# Patient Record
Sex: Male | Born: 1975 | Race: White | Hispanic: No | Marital: Married | Smoking: Never smoker
Health system: Southern US, Community
[De-identification: ages and names within clinical notes are randomized; demographics above are authoritative.]

## PROBLEM LIST (undated history)

## (undated) HISTORY — PX: OTHER SURGICAL HISTORY: SHX169

---

## 2005-11-21 ENCOUNTER — Emergency Department: Payer: Self-pay | Admitting: Emergency Medicine

## 2007-09-20 ENCOUNTER — Emergency Department: Payer: Self-pay | Admitting: Emergency Medicine

## 2009-07-18 ENCOUNTER — Emergency Department: Payer: Self-pay | Admitting: Emergency Medicine

## 2010-05-03 ENCOUNTER — Inpatient Hospital Stay: Payer: Self-pay | Admitting: Internal Medicine

## 2012-11-15 ENCOUNTER — Emergency Department: Payer: Self-pay | Admitting: Emergency Medicine

## 2014-05-15 ENCOUNTER — Emergency Department: Payer: Self-pay | Admitting: Emergency Medicine

## 2015-01-30 ENCOUNTER — Emergency Department
Admission: EM | Admit: 2015-01-30 | Discharge: 2015-01-30 | Disposition: A | Discharge status: Home / Self Care | Payer: Self-pay | Attending: Emergency Medicine | Admitting: Emergency Medicine

## 2015-01-30 ENCOUNTER — Encounter: Payer: Self-pay | Admitting: Emergency Medicine

## 2015-01-30 DIAGNOSIS — R42 Dizziness and giddiness: Secondary | ICD-10-CM | POA: Insufficient documentation

## 2015-01-30 DIAGNOSIS — R110 Nausea: Secondary | ICD-10-CM | POA: Insufficient documentation

## 2015-01-30 LAB — URINALYSIS COMPLETE WITH MICROSCOPIC (ARMC ONLY)
BACTERIA UA: NONE SEEN
BILIRUBIN URINE: NEGATIVE
GLUCOSE, UA: NEGATIVE mg/dL
Hgb urine dipstick: NEGATIVE
Ketones, ur: NEGATIVE mg/dL
Leukocytes, UA: NEGATIVE
Nitrite: NEGATIVE
Protein, ur: NEGATIVE mg/dL
RBC / HPF: NONE SEEN RBC/hpf (ref 0–5)
Specific Gravity, Urine: 1.013 (ref 1.005–1.030)
Squamous Epithelial / LPF: NONE SEEN
WBC UA: NONE SEEN WBC/hpf (ref 0–5)
pH: 6 (ref 5.0–8.0)

## 2015-01-30 LAB — COMPREHENSIVE METABOLIC PANEL
ALBUMIN: 4.3 g/dL (ref 3.5–5.0)
ALK PHOS: 60 U/L (ref 38–126)
ALT: 21 U/L (ref 17–63)
AST: 20 U/L (ref 15–41)
Anion gap: 6 (ref 5–15)
BILIRUBIN TOTAL: 0.6 mg/dL (ref 0.3–1.2)
BUN: 16 mg/dL (ref 6–20)
CALCIUM: 9.2 mg/dL (ref 8.9–10.3)
CO2: 27 mmol/L (ref 22–32)
CREATININE: 0.87 mg/dL (ref 0.61–1.24)
Chloride: 104 mmol/L (ref 101–111)
GFR calc Af Amer: >60 mL/min (ref >60)
GFR calc non Af Amer: >60 mL/min (ref >60)
GLUCOSE: 97 mg/dL (ref 65–99)
Potassium: 4.1 mmol/L (ref 3.5–5.1)
SODIUM: 137 mmol/L (ref 135–145)
TOTAL PROTEIN: 7 g/dL (ref 6.5–8.1)

## 2015-01-30 LAB — LIPASE, BLOOD: LIPASE: 23 U/L (ref 22–51)

## 2015-01-30 LAB — CBC
HCT: 47.1 % (ref 40.0–52.0)
HEMOGLOBIN: 15.9 g/dL (ref 13.0–18.0)
MCH: 29.8 pg (ref 26.0–34.0)
MCHC: 33.8 g/dL (ref 32.0–36.0)
MCV: 88 fL (ref 80.0–100.0)
Platelets: 225 10*3/uL (ref 150–440)
RBC: 5.35 MIL/uL (ref 4.40–5.90)
RDW: 13 % (ref 11.5–14.5)
WBC: 6.3 10*3/uL (ref 3.8–10.6)

## 2015-01-30 MED ORDER — SODIUM CHLORIDE 0.9 % IV BOLUS (SEPSIS)
1000.0000 mL | Freq: Once | INTRAVENOUS | Status: AC
  Administered 2015-01-30: 1000 mL via INTRAVENOUS

## 2015-01-30 MED ORDER — ONDANSETRON 4 MG PO TBDP: 4.0000 mg | ORAL_TABLET | Freq: Three times a day (TID) | ORAL | Status: DC | PRN

## 2015-01-30 MED ORDER — ONDANSETRON HCL 4 MG/2ML IJ SOLN
4.0000 mg | Freq: Once | INTRAMUSCULAR | Status: AC
  Administered 2015-01-30: 4 mg via INTRAVENOUS
  Filled 2015-01-30: qty 2
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
## 2015-01-30 NOTE — ED Notes (Signed)
Pt states that he tolerated water
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
## 2015-01-30 NOTE — Discharge Instructions (Signed)
Please drink plenty of fluid to stay well hydrated. Please take a full liquid diet for the next 12-24 hours, then advance to a bland BRAT diet as tolerated.   Please make an appointment with your primary care physician or with [facility] for follow-up.  Please return to the emergency department if you develop fever, pain, inability to keep down fluids, or any other symptoms concerning to you.

## 2015-01-30 NOTE — ED Notes (Signed)
MD notified pt tolerated PO challeged well. No reports of vomitting. At time of assessment pt was resting in bed with eyes closed.

## 2015-06-03 ENCOUNTER — Encounter: Payer: Self-pay | Admitting: Emergency Medicine

## 2015-06-03 ENCOUNTER — Emergency Department
Admission: EM | Admit: 2015-06-03 | Discharge: 2015-06-03 | Disposition: A | Discharge status: Home / Self Care | Payer: BLUE CROSS/BLUE SHIELD | Attending: Emergency Medicine | Admitting: Emergency Medicine

## 2015-06-03 ENCOUNTER — Emergency Department: Payer: BLUE CROSS/BLUE SHIELD

## 2015-06-03 DIAGNOSIS — M25462 Effusion, left knee: Secondary | ICD-10-CM | POA: Insufficient documentation

## 2015-06-03 DIAGNOSIS — M25562 Pain in left knee: Secondary | ICD-10-CM

## 2015-06-03 MED ORDER — ALBUTEROL SULFATE (2.5 MG/3ML) 0.083% IN NEBU: 2.5000 mg | INHALATION_SOLUTION | Freq: Once | RESPIRATORY_TRACT | Status: DC

## 2015-06-03 MED ORDER — ETODOLAC 400 MG PO TABS: 400.0000 mg | ORAL_TABLET | Freq: Two times a day (BID) | ORAL | Status: DC

## 2015-06-03 MED ORDER — HYDROCODONE-ACETAMINOPHEN 5-325 MG PO TABS: 1.0000 | ORAL_TABLET | ORAL | Status: DC | PRN

## 2015-06-03 MED ORDER — HYDROCODONE-ACETAMINOPHEN 5-325 MG PO TABS
2.0000 | ORAL_TABLET | Freq: Once | ORAL | Status: AC
  Administered 2015-06-03: 2 via ORAL
  Filled 2015-06-03: qty 2

## 2015-06-03 NOTE — Discharge Instructions (Signed)
Take medication only as directed. Etodolac 400 mg twice a day with food. Norco as needed for severe pain. Do not drive while taking Norco as this may cause drowsiness. Begin wearing your knee immobilizer for support. Call and make an appointment with Dr. Hyacinth Meeker if no improvement in 3-4 days

## 2015-06-03 NOTE — ED Provider Notes (Signed)
[facility] Emergency Department Provider Note  ____________________________________________  Time seen: Approximately 9:53 PM  I have reviewed the triage vital signs and the nursing notes.   HISTORY  Chief Complaint Knee Pain   HPI Keith Fox is a 40 y.o. male is here complaining of left knee pain. Patient states that he injured his knee approximately one year ago and was seen in the emergency room. He states he was given a knee immobilizer but he is unsure if it was x-rayed. He states that yesterday he "reinjured it" and has had pain since that time. He is unaware of his particular injury that caused the pain to exacerbate. He has taken ibuprofen multiple times without any relief.Currently he rates his pain is 7 out of 10.   History reviewed. No pertinent past medical history.  There are no active problems to display for this patient.   History reviewed. No pertinent past surgical history.  Current Outpatient Rx  Name  Route  Sig  Dispense  Refill  . etodolac (LODINE) 400 MG tablet   Oral   Take 1 tablet (400 mg total) by mouth 2 (two) times daily.   20 tablet   0   . HYDROcodone-acetaminophen (NORCO/VICODIN) 5-325 MG tablet   Oral   Take 1 tablet by mouth every 4 (four) hours as needed for moderate pain.   20 tablet   0   . ondansetron (ZOFRAN ODT) 4 MG disintegrating tablet   Oral   Take 1 tablet (4 mg total) by mouth every 8 (eight) hours as needed for nausea or vomiting.   20 tablet   0     Allergies Review of patient's allergies indicates no known allergies.  No family history on file.  Social History Social History  Substance Use Topics  . Smoking status: Never Smoker   . Smokeless tobacco: None  . Alcohol Use: Yes    Review of Systems Constitutional: No fever/chills Cardiovascular: Denies chest pain. Respiratory: Denies shortness of breath. Gastrointestinal:   No nausea, no vomiting.  Musculoskeletal: Positive for  left knee pain. Skin: Negative for rash. Neurological: Negative for  focal weakness or numbness.  10-point ROS otherwise negative.  ____________________________________________   PHYSICAL EXAM:  VITAL SIGNS: ED Triage Vitals  Enc Vitals Group     BP 06/03/15 2125 152/77 mmHg     Pulse Rate 06/03/15 2125 73     Resp 06/03/15 2125 20     Temp 06/03/15 2125 98 F (36.7 C)     Temp Source 06/03/15 2125 Oral     SpO2 06/03/15 2125 95 %     Weight 06/03/15 2125 32 lb (14.515 kg)     Height 06/03/15 2125  (1.803 m)     Head Cir --      Peak Flow --      Pain Score 06/03/15 2125 7     Pain Loc --      Pain Edu? --      Excl. in GC? --     Constitutional: Alert and oriented. Well appearing and in no acute distress. Eyes: Conjunctivae are normal. PERRL. EOMI. Head: Atraumatic. Nose: No congestion/rhinnorhea. Neck: No stridor.   Cardiovascular: Normal rate, regular rhythm. Grossly normal heart sounds.  Good peripheral circulation. Respiratory: Normal respiratory effort.  No retractions. Lungs CTAB. Gastrointestinal: Soft and nontender. No distention.  Musculoskeletal: There is no gross deformity on examination of left knee. Range of motion is restricted secondary to patient's pain. Ligaments are stable  bilaterally. There is no effusion present. There is moderate tenderness on palpation of the medial aspect of the tibia. Neurologic:  Normal speech and language. No gross focal neurologic deficits are appreciated. No gait instability. Skin:  Skin is warm, dry and intact. No rash noted. No erythema, ecchymosis or abrasions were noted. Psychiatric: Mood and affect are normal. Speech and behavior are normal.  ____________________________________________   LABS (all labs ordered are listed, but only abnormal results are displayed)  Labs Reviewed - No data to display RADIOLOGY  Left knee per radiologist shows joint effusion without acute fracture or  dislocation. ____________________________________________   PROCEDURES  Procedure(s) performed: None  Critical Care performed: No  ____________________________________________   INITIAL IMPRESSION / ASSESSMENT AND PLAN / ED COURSE  Pertinent labs & imaging results that were available during my care of the patient were reviewed by me and considered in my medical decision making (see chart for details).  Patient has knee immobilizer at home. He is given a prescription for Norco as needed for severe pain and etodolac as needed for inflammation and pain. He is to follow-up with Dr. Hyacinth Meeker if any continued problems. He is also given a note stating that he needs to wear his knee immobilizer while at work. ____________________________________________   FINAL CLINICAL IMPRESSION(S) / ED DIAGNOSES  Final diagnoses:  Knee joint effusion, left  Knee pain, acute, left      Tommi Rumps, PA-C 06/03/15 2346  Sharman Cheek, MD 06/06/15 1220

## 2015-06-03 NOTE — ED Notes (Addendum)
Patient ambulatory to triage with steady gait, without difficulty or distress noted; pt reports left knee pain from injury year ago; st has been seen for such here before

## 2016-12-07 ENCOUNTER — Emergency Department
Admission: EM | Admit: 2016-12-07 | Discharge: 2016-12-07 | Disposition: A | Discharge status: Home / Self Care | Payer: Self-pay | Attending: Emergency Medicine | Admitting: Emergency Medicine

## 2016-12-07 ENCOUNTER — Emergency Department: Payer: Self-pay

## 2016-12-07 ENCOUNTER — Encounter: Payer: Self-pay | Admitting: Emergency Medicine

## 2016-12-07 DIAGNOSIS — X58XXXA Exposure to other specified factors, initial encounter: Secondary | ICD-10-CM | POA: Insufficient documentation

## 2016-12-07 DIAGNOSIS — Z79899 Other long term (current) drug therapy: Secondary | ICD-10-CM | POA: Insufficient documentation

## 2016-12-07 DIAGNOSIS — Y939 Activity, unspecified: Secondary | ICD-10-CM | POA: Insufficient documentation

## 2016-12-07 DIAGNOSIS — Y999 Unspecified external cause status: Secondary | ICD-10-CM | POA: Insufficient documentation

## 2016-12-07 DIAGNOSIS — Y929 Unspecified place or not applicable: Secondary | ICD-10-CM | POA: Insufficient documentation

## 2016-12-07 DIAGNOSIS — M545 Low back pain, unspecified: Secondary | ICD-10-CM

## 2016-12-07 DIAGNOSIS — S39012A Strain of muscle, fascia and tendon of lower back, initial encounter: Secondary | ICD-10-CM | POA: Insufficient documentation

## 2016-12-07 LAB — URINALYSIS, COMPLETE (UACMP) WITH MICROSCOPIC
Bacteria, UA: NONE SEEN
Bilirubin Urine: NEGATIVE
GLUCOSE, UA: NEGATIVE mg/dL
Hgb urine dipstick: NEGATIVE
Ketones, ur: NEGATIVE mg/dL
Leukocytes, UA: NEGATIVE
NITRITE: NEGATIVE
PROTEIN: 30 mg/dL — AB
Specific Gravity, Urine: 1.027 (ref 1.005–1.030)
pH: 5 (ref 5.0–8.0)

## 2016-12-07 MED ORDER — NABUMETONE 750 MG PO TABS: 750.0000 mg | ORAL_TABLET | Freq: Two times a day (BID) | ORAL | 0 refills | Status: DC

## 2016-12-07 MED ORDER — CYCLOBENZAPRINE HCL 5 MG PO TABS: 5.0000 mg | ORAL_TABLET | Freq: Three times a day (TID) | ORAL | 0 refills | Status: DC | PRN

## 2016-12-07 MED ORDER — ORPHENADRINE CITRATE 30 MG/ML IJ SOLN
60.0000 mg | INTRAMUSCULAR | Status: AC
  Administered 2016-12-07: 60 mg via INTRAMUSCULAR
  Filled 2016-12-07: qty 2

## 2016-12-07 MED ORDER — KETOROLAC TROMETHAMINE 60 MG/2ML IM SOLN
60.0000 mg | Freq: Once | INTRAMUSCULAR | Status: AC
  Administered 2016-12-07: 60 mg via INTRAMUSCULAR
  Filled 2016-12-07: qty 2

## 2016-12-07 NOTE — ED Triage Notes (Signed)
Patient arrives to [facility] ED via POV with C/O bilateral low back pain for 3 weeks, worsening over time. Unknown mechanism. States that he  a previous back injury 2 years ago

## 2016-12-07 NOTE — Discharge Instructions (Signed)
Your exam, x-ray, and urine tests were essentially normal today. You have some very mild degenerative changes (arthritis) in the lower back. Your symptoms are consistent with a low back muscle strain. Take the prescription meds as directed. Consider applying ice packs and/or moist heat to the low back for comfort. Follow-up with local primary care or community clinics for ongoing symptom management. Return to the ED for worsening symptoms.

## 2016-12-07 NOTE — ED Notes (Signed)

## 2016-12-07 NOTE — ED Notes (Addendum)
See triage note  States he developed pain to lower back about 3 weeks ago  Denies any specific injury  states pain is non radiating  Ambulates slowly but w/o limp  Denies any urinary sx's or bowel issues  States he has had min relief with OTC meds

## 2016-12-07 NOTE — ED Provider Notes (Signed)
[facility] Emergency Department Provider Note ____________________________________________  Time seen: 1112  I have reviewed the triage vital signs and the nursing notes.  HISTORY  Chief Complaint  Back Pain  HPI Keith Fox is a 41 y.o. male visits to the ED for evaluation of a 3 week complaint of intermittent back pain.patient denies any specific injury, accident, trauma, or mechanism preceding his pain. He reports a previous back injury some 8 years prior, but denies any chronic low back pain or interim treatment or management. He has been taking ibuprofen daily for pain relief. He denies any distal paresthesias, foot drop, bladder or bowel incontinence.  History reviewed. No pertinent past medical history.  There are no active problems to display for this patient.  History reviewed. No pertinent surgical history.  Prior to Admission medications   Medication Sig Start Date End Date Taking? Authorizing Provider  cyclobenzaprine (FLEXERIL) 5 MG tablet Take 1 tablet (5 mg total) by mouth 3 (three) times daily as needed for muscle spasms. 12/07/16   Eula Mazzola, Charlesetta Ivory, PA-C  etodolac (LODINE) 400 MG tablet Take 1 tablet (400 mg total) by mouth 2 (two) times daily. 06/03/15   Tommi Rumps, PA-C  HYDROcodone-acetaminophen (NORCO/VICODIN) 5-325 MG tablet Take 1 tablet by mouth every 4 (four) hours as needed for moderate pain. 06/03/15   Tommi Rumps, PA-C  nabumetone (RELAFEN) 750 MG tablet Take 1 tablet (750 mg total) by mouth 2 (two) times daily. 12/07/16   Syrus Nakama, Charlesetta Ivory, PA-C  ondansetron (ZOFRAN ODT) 4 MG disintegrating tablet Take 1 tablet (4 mg total) by mouth every 8 (eight) hours as needed for nausea or vomiting. 01/30/15   Rockne Menghini, MD   Allergies Patient has no known allergies.  History reviewed. No pertinent family history.  Social History Social History  Substance Use Topics  . Smoking status: Never Smoker  .  Smokeless tobacco: Not on file  . Alcohol use Yes    Review of Systems  Constitutional: Negative for fever. Cardiovascular: Negative for chest pain. Respiratory: Negative for shortness of breath. Gastrointestinal: Negative for abdominal pain, vomiting and diarrhea. Genitourinary: Negative for dysuria. Musculoskeletal: positivefor back pain. Skin: Negative for rash. Neurological: Negative for headaches, focal weakness or numbness. ____________________________________________  PHYSICAL EXAM:  VITAL SIGNS: ED Triage Vitals  Enc Vitals Group     BP 12/07/16 1054 (!) 145/81     Pulse Rate 12/07/16 1054 67     Resp 12/07/16 1054 18     Temp 12/07/16 1054 97.6 F (36.4 C)     Temp Source 12/07/16 1054 Oral     SpO2 12/07/16 1054 96 %     Weight 12/07/16 1054 (!) 350 lb (158.8 kg)     Height 12/07/16 1054 5\' 11"  (1.803 m)     Head Circumference --      Peak Flow --      Pain Score 12/07/16 1049 9     Pain Loc --      Pain Edu? --      Excl. in GC? --     Constitutional: Alert and oriented. Well appearing and in no distress. Pleasant patient with a large body habitus. Head: Normocephalic and atraumatic. Cardiovascular: Normal rate, regular rhythm. Normal distal pulses. Respiratory: Normal respiratory effort. No wheezes/rales/rhonchi. Gastrointestinal: Obese, soft and nontender. No distention, rebound, guarding, rigidity, or organomegaly. No CVA tenderness Musculoskeletal: Normal spinal alignment without midline tenderness, spasm, deformity, or step-off. Tenderness to palp over the lumbosacral paraspinal musculature.  Normal supine to sit transition. Normal lumbar flexion with decreased, self-limited extension ROM. Normal toe/heel raise. Normal hip flexion/extension. Nontender with normal range of motion in all extremities.  Neurologic:  CN II-XII grossly intact. Normal LE DTRs bilaterally. Normal gait without ataxia. Normal speech and language. No gross focal neurologic deficits are  appreciated. ____________________________________________   LABORATORY   Labs Reviewed  URINALYSIS, COMPLETE (UACMP) WITH MICROSCOPIC - Abnormal; Notable for the following:       Result Value   Color, Urine YELLOW (*)    APPearance CLOUDY (*)    Protein, ur 30 (*)    Squamous Epithelial / LPF 0-5 (*)    All other components within normal limits  _______________________________________________  RADIOLOGY  Lumbar Spine IMPRESSION: No acute abnormality in lumbar spine. Minimal degenerative disease. ____________________________________________  PROCEDURES  Toradol 60 mg IM Norflex 60 mg IM ____________________________________________  INITIAL IMPRESSION / ASSESSMENT AND PLAN / ED COURSE  Patient was ED evaluation of low back pain for the last 2 weeks. He denies any known injury, accident, or trauma. He presents now with x-rays negative for any acute fracture dislocation. Urinalysis negative. Kidney stone or bladder infection. He reports improvement after IM medication administration. He'll be discharged with transient for Relafen and cyclobenzaprine. He should follow-up with one of local community clinics for ongoing symptom management. Return precautions are reviewed. ____________________________________________  FINAL CLINICAL IMPRESSION(S) / ED DIAGNOSES  Final diagnoses:  Acute bilateral low back pain without sciatica  Strain of lumbar region, initial encounter      Lissa Hoard, PA-C 12/07/16 1242    Arnaldo Natal, MD 12/07/16 [id number]

## 2017-03-25 ENCOUNTER — Emergency Department
Admission: EM | Admit: 2017-03-25 | Discharge: 2017-03-26 | Disposition: A | Discharge status: Home / Self Care | Payer: Self-pay | Attending: Emergency Medicine | Admitting: Emergency Medicine

## 2017-03-25 DIAGNOSIS — X58XXXA Exposure to other specified factors, initial encounter: Secondary | ICD-10-CM | POA: Insufficient documentation

## 2017-03-25 DIAGNOSIS — S5002XA Contusion of left elbow, initial encounter: Secondary | ICD-10-CM | POA: Insufficient documentation

## 2017-03-25 DIAGNOSIS — Z79899 Other long term (current) drug therapy: Secondary | ICD-10-CM | POA: Insufficient documentation

## 2017-03-25 DIAGNOSIS — Y999 Unspecified external cause status: Secondary | ICD-10-CM | POA: Insufficient documentation

## 2017-03-25 DIAGNOSIS — Y939 Activity, unspecified: Secondary | ICD-10-CM | POA: Insufficient documentation

## 2017-03-25 DIAGNOSIS — Y9259 Other trade areas as the place of occurrence of the external cause: Secondary | ICD-10-CM | POA: Insufficient documentation

## 2017-03-25 LAB — URINE DRUG SCREEN, QUALITATIVE (ARMC ONLY)
Amphetamines, Ur Screen: NOT DETECTED
BARBITURATES, UR SCREEN: NOT DETECTED
BENZODIAZEPINE, UR SCRN: NOT DETECTED
CANNABINOID 50 NG, UR ~~LOC~~: NOT DETECTED
Cocaine Metabolite,Ur ~~LOC~~: NOT DETECTED
MDMA (Ecstasy)Ur Screen: NOT DETECTED
METHADONE SCREEN, URINE: NOT DETECTED
OPIATE, UR SCREEN: NOT DETECTED
Phencyclidine (PCP) Ur S: NOT DETECTED
Tricyclic, Ur Screen: NOT DETECTED

## 2017-03-25 LAB — BASIC METABOLIC PANEL
Anion gap: 13 (ref 5–15)
BUN: 22 mg/dL — ABNORMAL HIGH (ref 6–20)
CALCIUM: 9.6 mg/dL (ref 8.9–10.3)
CO2: 26 mmol/L (ref 22–32)
CREATININE: 0.94 mg/dL (ref 0.61–1.24)
Chloride: 97 mmol/L — ABNORMAL LOW (ref 101–111)
GFR calc non Af Amer: >60 mL/min (ref >60)
Glucose, Bld: 127 mg/dL — ABNORMAL HIGH (ref 65–99)
Potassium: 3.4 mmol/L — ABNORMAL LOW (ref 3.5–5.1)
SODIUM: 136 mmol/L (ref 135–145)

## 2017-03-25 LAB — CBC
HEMATOCRIT: 47.8 % (ref 40.0–52.0)
Hemoglobin: 16.5 g/dL (ref 13.0–18.0)
MCH: 30.2 pg (ref 26.0–34.0)
MCHC: 34.5 g/dL (ref 32.0–36.0)
MCV: 87.3 fL (ref 80.0–100.0)
Platelets: 278 10*3/uL (ref 150–440)
RBC: 5.47 MIL/uL (ref 4.40–5.90)
RDW: 13 % (ref 11.5–14.5)
WBC: 10.5 10*3/uL (ref 3.8–10.6)
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
## 2017-03-26 ENCOUNTER — Emergency Department: Payer: Self-pay

## 2017-03-26 MED ORDER — HYDROCODONE-ACETAMINOPHEN 5-325 MG PO TABS
1.0000 | ORAL_TABLET | Freq: Once | ORAL | Status: AC
  Administered 2017-03-26: 1 via ORAL
  Filled 2017-03-26: qty 1

## 2017-03-26 MED ORDER — IBUPROFEN 600 MG PO TABS: 600.0000 mg | ORAL_TABLET | Freq: Three times a day (TID) | ORAL | 0 refills | Status: DC | PRN

## 2017-03-26 MED ORDER — IBUPROFEN 600 MG PO TABS
600.0000 mg | ORAL_TABLET | Freq: Once | ORAL | Status: AC
  Administered 2017-03-26: 600 mg via ORAL
  Filled 2017-03-26: qty 1
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
## 2017-03-26 NOTE — Discharge Instructions (Signed)
Fortunately today your x-rays and your blood work were reassuring.  Please take ibuprofen as needed for pain and swelling and establish care with primary care this coming Monday for recheck.  Return to the emergency department sooner for any concerns whatsoever.  It was a pleasure to take care of you today, and thank you for coming to our emergency department.  If you have any questions or concerns before leaving please ask the nurse to grab me and I'm more than happy to go through your aftercare instructions again.  If you were prescribed any opioid pain medication today such as Norco, Vicodin, Percocet, morphine, hydrocodone, or oxycodone please make sure you do not drive when you are taking this medication as it can alter your ability to drive safely.  If you have any concerns once you are home that you are not improving or are in fact getting worse before you can make it to your follow-up appointment, please do not hesitate to call 911 and come back for further evaluation.  Merrily BrittleNeil Kaeden Mester, MD  Results for orders placed or performed during the hospital encounter of 03/25/17  CBC  Result Value Ref Range   WBC 10.5 3.8 - 10.6 K/uL   RBC 5.47 4.40 - 5.90 MIL/uL   Hemoglobin 16.5 13.0 - 18.0 g/dL   HCT 21.347.8 08.640.0 - 57.852.0 %   MCV 87.3 80.0 - 100.0 fL   MCH 30.2 26.0 - 34.0 pg   MCHC 34.5 32.0 - 36.0 g/dL   RDW 46.913.0 62.911.5 - 52.814.5 %   Platelets 278 150 - 440 K/uL  Basic metabolic panel  Result Value Ref Range   Sodium 136 135 - 145 mmol/L   Potassium 3.4 (L) 3.5 - 5.1 mmol/L   Chloride 97 (L) 101 - 111 mmol/L   CO2 26 22 - 32 mmol/L   Glucose, Bld 127 (H) 65 - 99 mg/dL   BUN 22 (H) 6 - 20 mg/dL   Creatinine, Ser 4.130.94 0.61 - 1.24 mg/dL   Calcium 9.6 8.9 - 24.410.3 mg/dL   GFR calc non Af Amer >60 >60 mL/min   GFR calc Af Amer >60 >60 mL/min   Anion gap 13 5 - 15  Urine Drug Screen, Qualitative ([facility] only)  Result Value Ref Range   Tricyclic, Ur Screen NONE DETECTED NONE DETECTED   Amphetamines, Ur Screen NONE DETECTED NONE DETECTED   MDMA (Ecstasy)Ur Screen NONE DETECTED NONE DETECTED   Cocaine Metabolite,Ur [place] NONE DETECTED NONE DETECTED   Opiate, Ur Screen NONE DETECTED NONE DETECTED   Phencyclidine (PCP) Ur S NONE DETECTED NONE DETECTED   Cannabinoid 50 Ng, Ur [place] NONE DETECTED NONE DETECTED   Barbiturates, Ur Screen NONE DETECTED NONE DETECTED   Benzodiazepine, Ur Scrn NONE DETECTED NONE DETECTED   Methadone Scn, Ur NONE DETECTED NONE DETECTED   Dg Elbow Complete Left  Result Date: 03/26/2017 CLINICAL DATA:  Initial evaluation for acute elbow pain. EXAM: LEFT ELBOW - COMPLETE 3+ VIEW COMPARISON:  None. FINDINGS: No acute fracture dislocation. No joint effusion. Radial head intact. Mild degenerative spurring present the radial head and olecranon. No soft tissue abnormality. IMPRESSION: No acute osseous abnormality about the left elbow. Electronically Signed   By: Rise MuBenjamin  McClintock M.D.   On: 03/26/2017 01:19   Dg Wrist Complete Left  Result Date: 03/26/2017 CLINICAL DATA:  Initial evaluation for acute wrist pain. EXAM: LEFT WRIST - COMPLETE 3+ VIEW COMPARISON:  None. FINDINGS: No acute fracture dislocation. Normal radiocarpal and distal radioulnar articulations intact. No  acute soft tissue abnormality. Osseous mineralization normal. IMPRESSION: No acute osseous abnormality about the left wrist. Electronically Signed   By: Rise MuBenjamin  McClintock M.D.   On: 03/26/2017 01:21   Dg Shoulder Left  Result Date: 03/26/2017 CLINICAL DATA:  Initial evaluation for acute upper extremity pain. EXAM: LEFT SHOULDER - 2+ VIEW COMPARISON:  None. FINDINGS: No acute fracture or dislocation. Humeral head in normal alignment with the glenoid. AC joint approximated. 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 No periarticular calcification. Visualized left hemithorax unremarkable. IMPRESSION: No acute osseous abnormality about the left shoulder.  Electronically Signed   By: Rise MuBenjamin  McClintock M.D.   On: 03/26/2017 01:16

## 2017-06-12 ENCOUNTER — Other Ambulatory Visit: Payer: Self-pay

## 2017-06-12 ENCOUNTER — Encounter: Payer: Self-pay | Admitting: Emergency Medicine

## 2017-06-12 ENCOUNTER — Emergency Department: Payer: No Typology Code available for payment source

## 2017-06-12 ENCOUNTER — Emergency Department
Admission: EM | Admit: 2017-06-12 | Discharge: 2017-06-12 | Disposition: A | Discharge status: Home / Self Care | Payer: No Typology Code available for payment source | Attending: Emergency Medicine | Admitting: Emergency Medicine

## 2017-06-12 DIAGNOSIS — J209 Acute bronchitis, unspecified: Secondary | ICD-10-CM | POA: Diagnosis not present

## 2017-06-12 DIAGNOSIS — Z79899 Other long term (current) drug therapy: Secondary | ICD-10-CM | POA: Diagnosis not present

## 2017-06-12 DIAGNOSIS — R05 Cough: Secondary | ICD-10-CM | POA: Diagnosis present

## 2017-06-12 MED ORDER — AZITHROMYCIN 250 MG PO TABS: ORAL_TABLET | ORAL | 0 refills | Status: DC

## 2017-06-12 MED ORDER — PREDNISONE 10 MG PO TABS: 50.0000 mg | ORAL_TABLET | Freq: Every day | ORAL | 0 refills | Status: DC

## 2017-06-12 MED ORDER — GUAIFENESIN-CODEINE 100-10 MG/5ML PO SOLN: 10.0000 mL | Freq: Three times a day (TID) | ORAL | 0 refills | Status: DC | PRN

## 2017-06-12 NOTE — ED Notes (Signed)
C/o body aches, chills/sweats, and cough. Pt states "everyone at my work has been sick with same"

## 2017-06-12 NOTE — ED Provider Notes (Signed)
[facility] Emergency Department Provider Note  ____________________________________________  Time seen: Approximately 4:17 PM  I have reviewed the triage vital signs and the nursing notes.   HISTORY  Chief Complaint Cough   HPI Keith Fox is a 42 y.o. male who presents to the emergency department for treatment and evaluation of cough, fatigue, sore throat, chills, and body aches that have been present for the past 2-3 weeks.  Patient has had multiple exposures to illnesses.  He has taken some over-the-counter cold medication with only temporary relief.  Patient does not smoke cigarettes.  He does not think that he has had a fever, but states that he has had cold chills.  History reviewed. No pertinent past medical history.  There are no active problems to display for this patient.   History reviewed. No pertinent surgical history.  Prior to Admission medications   Medication Sig Start Date End Date Taking? Authorizing Provider  azithromycin (ZITHROMAX) 250 MG tablet 2 tablets today, then 1 tablet for the next 4 days. 06/12/17   Yolander Goodie, Rulon Eisenmengerari B, FNP  cyclobenzaprine (FLEXERIL) 5 MG tablet Take 1 tablet (5 mg total) by mouth 3 (three) times daily as needed for muscle spasms. 12/07/16   Menshew, Charlesetta IvoryJenise V Bacon, PA-C  etodolac (LODINE) 400 MG tablet Take 1 tablet (400 mg total) by mouth 2 (two) times daily. 06/03/15   Tommi RumpsSummers, Rhonda L, PA-C  guaiFENesin-codeine 100-10 MG/5ML syrup Take 10 mLs by mouth 3 (three) times daily as needed. 06/12/17   Glessie Eustice B, FNP  ibuprofen (ADVIL,MOTRIN) 600 MG tablet Take 1 tablet (600 mg total) by mouth every 8 (eight) hours as needed. 03/26/17   Merrily Brittleifenbark, Neil, MD  nabumetone (RELAFEN) 750 MG tablet Take 1 tablet (750 mg total) by mouth 2 (two) times daily. 12/07/16   Menshew, Charlesetta IvoryJenise V Bacon, PA-C  ondansetron (ZOFRAN ODT) 4 MG disintegrating tablet Take 1 tablet (4 mg total) by mouth every 8 (eight) hours as needed for  nausea or vomiting. 01/30/15   Rockne MenghiniNorman, Anne-Caroline, MD  predniSONE (DELTASONE) 10 MG tablet Take 5 tablets (50 mg total) by mouth daily. 06/12/17   Chinita Pesterriplett, Siddhi Dornbush B, FNP    Allergies Patient has no known allergies.  History reviewed. No pertinent family history.  Social History Social History   Tobacco Use  . Smoking status: Never Smoker  . Smokeless tobacco: Never Used  Substance Use Topics  . Alcohol use: Yes  . Drug use: No    Review of Systems Constitutional: Negative for fever/positive for chills ENT: Positive for sore throat. Cardiovascular: Denies chest pain. Respiratory: Negative for shortness of breath.  Positive for cough. Gastrointestinal: Negative nausea, negative for vomiting.  No diarrhea.  Musculoskeletal: Positive for body aches Skin: Negative for rash. Neurological: Positive for headaches ____________________________________________   PHYSICAL EXAM:  VITAL SIGNS: ED Triage Vitals  Enc Vitals Group     BP 06/12/17 0837 (!) 147/100     Pulse Rate 06/12/17 0837 73     Resp 06/12/17 0837 20     Temp 06/12/17 0836 (!) 97.5 F (36.4 C)     Temp Source 06/12/17 0836 Oral     SpO2 06/12/17 0837 98 %     Weight 06/12/17 0836 (!) 340 lb (154.2 kg)     Height 06/12/17 0836 5\' 11"  (1.803 m)     Head Circumference --      Peak Flow --      Pain Score 06/12/17 0836 6     Pain  Loc --      Pain Edu? --      Excl. in GC? --     Constitutional: Alert and oriented.  Acutely ill appearing and in no acute distress. Eyes: Conjunctivae are normal. EOMI. Ears: Tympanic membranes are injected without erythema Nose: Sinus congestion noted; no rhinnorhea. Mouth/Throat: Mucous membranes are moist.  Oropharynx mildly erythematous. Tonsils 1+ without exudate. Neck: No stridor.  Lymphatic: Bilateral anterior cervical lymphadenopathy. Cardiovascular: Normal rate, regular rhythm. Good peripheral circulation. Respiratory: Normal respiratory effort.  No retractions.   Breath sounds clear to auscultation. Gastrointestinal: Soft and nontender.  Musculoskeletal: FROM x 4 extremities.  Neurologic:  Normal speech and language.  Skin:  Skin is warm, dry and intact. No rash noted. Psychiatric: Mood and affect are normal. Speech and behavior are normal.  ____________________________________________   LABS (all labs ordered are listed, but only abnormal results are displayed)  Labs Reviewed - No data to display ____________________________________________  EKG  Not indicated ____________________________________________  RADIOLOGY  Chest x-ray is negative for acute cardiopulmonary abnormality per radiology ____________________________________________   PROCEDURES  Procedure(s) performed: None  Critical Care performed: No ____________________________________________   INITIAL IMPRESSION / ASSESSMENT AND PLAN / ED COURSE  42 year old male presenting to the emergency department for treatment and evaluation of symptoms and most consistent with a bronchitis.  Due to length of illness, he will be treated with azithromycin.  He will also be given a prescription for prednisone and guaifenesin with codeine the patient was encouraged to rest and increase the amount of fluids that he is drinking.[name] He was encouraged to follow-up with his primary care provider or the primary care provider for choice for symptoms that are not improving over the next 2-3 days.  He was instructed to return to the emergency department for symptoms of change or worsen if he is unable to schedule an appointment to  Medications - No data to display  ED Discharge Orders        Ordered    guaiFENesin-codeine 100-10 MG/5ML syrup  3 times daily PRN     06/12/17 1032    predniSONE (DELTASONE) 10 MG tablet  Daily     06/12/17 1032    azithromycin (ZITHROMAX) 250 MG tablet     06/12/17 1032      Pertinent labs & imaging results that were available during my care of the patient were  reviewed by me and considered in my medical decision making (see chart for details).    If controlled substance prescribed during this visit, 12 month history viewed on the [facility] prior to issuing an initial prescription for Schedule II or III opiod. ____________________________________________   FINAL CLINICAL IMPRESSION(S) / ED DIAGNOSES  Final diagnoses:  Acute bronchitis, unspecified organism    Note:  This document was prepared using Dragon voice recognition software and may include unintentional dictation errors.     Chinita Pester, FNP 06/12/17 1620    Minna Antis, MD 06/16/17 1513

## 2017-06-12 NOTE — ED Triage Notes (Addendum)
Cough X 2-3 weeks per pt. He thought was getting a little better but now back.  Has also had sore throat.  Unlabored, VSS. Ambulatory.

## 2017-07-22 ENCOUNTER — Ambulatory Visit (INDEPENDENT_AMBULATORY_CARE_PROVIDER_SITE_OTHER): Payer: No Typology Code available for payment source | Admitting: Physician Assistant

## 2017-07-22 ENCOUNTER — Encounter: Payer: Self-pay | Admitting: Physician Assistant

## 2017-07-22 VITALS — BP 132/84 | HR 66 | Temp 98.4°F | Resp 16 | Ht 71.0 in | Wt 369.0 lb

## 2017-07-22 DIAGNOSIS — Z136 Encounter for screening for cardiovascular disorders: Secondary | ICD-10-CM

## 2017-07-22 DIAGNOSIS — Z13220 Encounter for screening for lipoid disorders: Secondary | ICD-10-CM | POA: Diagnosis not present

## 2017-07-22 DIAGNOSIS — G5611 Other lesions of median nerve, right upper limb: Secondary | ICD-10-CM

## 2017-07-22 DIAGNOSIS — N6320 Unspecified lump in the left breast, unspecified quadrant: Secondary | ICD-10-CM | POA: Diagnosis not present

## 2017-07-22 DIAGNOSIS — Z6841 Body Mass Index (BMI) 40.0 and over, adult: Secondary | ICD-10-CM

## 2017-07-22 DIAGNOSIS — J309 Allergic rhinitis, unspecified: Secondary | ICD-10-CM

## 2017-07-22 DIAGNOSIS — Z0000 Encounter for general adult medical examination without abnormal findings: Secondary | ICD-10-CM

## 2017-07-22 DIAGNOSIS — Z131 Encounter for screening for diabetes mellitus: Secondary | ICD-10-CM

## 2017-07-22 MED ORDER — MELOXICAM 15 MG PO TABS: 15.0000 mg | ORAL_TABLET | Freq: Every day | ORAL | 0 refills | Status: DC

## 2017-07-22 NOTE — Progress Notes (Signed)
Patient: Keith AreaDaniel Wayne Fox Male    DOB: 07/03/1975   42 y.o.   MRN: [id number] Visit Date: 07/22/2017  Today's Provider: Margaretann LovelessJennifer M Hollee Fate, PA-C   Chief Complaint  Patient presents with  . New Patient (Initial Visit)  . Cyst   Subjective:    HPI Patient comes in today to establish care and annual physical.  He c/o a mass in his left breast that has been there for several (2-3) months. He reports that it varies in size. He reports that it gets big, then gets small. He denies any tenderness or swelling. He has not used anything OTC for his symptoms. Did previously have nipple piercings. The left nipple piercing was "pulled out" twice. He is unsure if it may be scar tissue. There is no known family history of breast cancer.   He also reports that he has pain on his right elbow that has been present for several weeks. He reports that it is mildly tender, and denies any swelling or redness. Has recently picked up playing softball again once or twice per week after having the winter off.       No Known Allergies   Current Outpatient Medications:  .  azithromycin (ZITHROMAX) 250 MG tablet, 2 tablets today, then 1 tablet for the next 4 days. (Patient not taking: Reported on 07/22/2017), Disp: 6 each, Rfl: 0 .  cyclobenzaprine (FLEXERIL) 5 MG tablet, Take 1 tablet (5 mg total) by mouth 3 (three) times daily as needed for muscle spasms. (Patient not taking: Reported on 07/22/2017), Disp: 30 tablet, Rfl: 0 .  etodolac (LODINE) 400 MG tablet, Take 1 tablet (400 mg total) by mouth 2 (two) times daily. (Patient not taking: Reported on 07/22/2017), Disp: 20 tablet, Rfl: 0 .  guaiFENesin-codeine 100-10 MG/5ML syrup, Take 10 mLs by mouth 3 (three) times daily as needed. (Patient not taking: Reported on 07/22/2017), Disp: 120 mL, Rfl: 0 .  ibuprofen (ADVIL,MOTRIN) 600 MG tablet, Take 1 tablet (600 mg total) by mouth every 8 (eight) hours as needed. (Patient not taking: Reported on 07/22/2017),  Disp: 30 tablet, Rfl: 0 .  nabumetone (RELAFEN) 750 MG tablet, Take 1 tablet (750 mg total) by mouth 2 (two) times daily. (Patient not taking: Reported on 07/22/2017), Disp: 30 tablet, Rfl: 0 .  ondansetron (ZOFRAN ODT) 4 MG disintegrating tablet, Take 1 tablet (4 mg total) by mouth every 8 (eight) hours as needed for nausea or vomiting. (Patient not taking: Reported on 07/22/2017), Disp: 20 tablet, Rfl: 0 .  predniSONE (DELTASONE) 10 MG tablet, Take 5 tablets (50 mg total) by mouth daily. (Patient not taking: Reported on 07/22/2017), Disp: 25 tablet, Rfl: 0  Review of Systems  Constitutional: Negative.   HENT: Negative.   Eyes: Positive for itching.  Respiratory: Negative.   Cardiovascular: Negative.   Gastrointestinal: Negative.   Endocrine: Negative.   Genitourinary: Negative.   Musculoskeletal: Positive for arthralgias and myalgias. Negative for joint swelling.  Skin: Negative for color change and rash.  Allergic/Immunologic: Positive for environmental allergies.  Neurological: Negative.   Hematological: Negative for adenopathy. Does not bruise/bleed easily.  Psychiatric/Behavioral: Negative.     Social History   Tobacco Use  . Smoking status: Never Smoker  . Smokeless tobacco: Current User    Types: Chew  Substance Use Topics  . Alcohol use: Yes    Alcohol/week: 1.8 oz    Types: 3 Cans of beer per week   Objective:   BP 132/84 (BP Location:  Right Arm, Patient Position: Sitting, Cuff Size: Large)   Pulse 66   Temp 98.4 F (36.9 C)   Resp 16   Ht 5\' 11"  (1.803 m)   Wt (!) 369 lb (167.4 kg)   SpO2 96%   BMI 51.47 kg/m  Vitals:   07/22/17 0810  BP: 132/84  Pulse: 66  Resp: 16  Temp: 98.4 F (36.9 C)  SpO2: 96%  Weight: (!) 369 lb (167.4 kg)  Height: 5\' 11"  (1.803 m)     Physical Exam  Constitutional: He is oriented to person, place, and time. He appears well-developed and well-nourished.  HENT:  Head: Normocephalic and atraumatic.  Right Ear: Hearing,  tympanic membrane, external ear and ear canal normal.  Left Ear: Hearing, tympanic membrane, external ear and ear canal normal.  Nose: Nose normal.  Mouth/Throat: Uvula is midline, oropharynx is clear and moist and mucous membranes are normal.  Eyes: Pupils are equal, round, and reactive to light. Conjunctivae and EOM are normal. Right eye exhibits no discharge.  Neck: Normal range of motion. Neck supple. No tracheal deviation present. No thyromegaly present.  Cardiovascular: Normal rate, regular rhythm, normal heart sounds and intact distal pulses.  No murmur heard. Pulmonary/Chest: Effort normal and breath sounds normal. No respiratory distress. He has no wheezes. He has no rales. He exhibits no tenderness. Right breast exhibits no inverted nipple, no mass, no nipple discharge, no skin change and no tenderness. Left breast exhibits mass. Left breast exhibits no inverted nipple, no nipple discharge, no skin change and no tenderness. Breasts are symmetrical.    Abdominal: Soft. He exhibits no distension and no mass. There is no tenderness. There is no rebound and no guarding.  Musculoskeletal: He exhibits no edema.       Right shoulder: Normal.       Right elbow: He exhibits decreased range of motion and swelling. Tenderness found. Medial epicondyle tenderness noted.       Left elbow: Normal.       Right wrist: Normal.       Left wrist: Normal.  Pain noted with elbow flexion, supination and pronation. Tenderness noted over medial epicondyle as well as over pronator teres muscle  Lymphadenopathy:    He has no cervical adenopathy.  Neurological: He is alert and oriented to person, place, and time. He has normal reflexes. He displays normal reflexes. No cranial nerve deficit. He exhibits normal muscle tone. Coordination normal.  Skin: Skin is warm and dry. No rash noted. No erythema.  Psychiatric: He has a normal mood and affect. His behavior is normal. Judgment and thought content normal.    Vitals reviewed.       Assessment & Plan:     1. Annual physical exam Normal physical exam today. Will check labs as below and f/u pending lab results. If labs are stable and WNL he will not need to have these rechecked for one year at his next annual physical exam. He is to call the office in the meantime if he has any acute issue, questions or concerns. - CBC w/Diff/Platelet - Comprehensive Metabolic Panel (CMET) - TSH - Lipid Profile - HgB A1c  2. Allergic rhinitis, unspecified seasonality, unspecified trigger Most problematic symptom is dry, itchy eyes. Discussed adding eye drops for allergies prn. Does use tylenol sinus prn when having other symptoms.   3. Left breast mass Noted at 9 oclock 2-3 mm from nipple line of left breast. Suspect cyst or scar tissue but will get [place] as  below for further delineation.  - US BREAST COMPLETE UNI LEFT INC AXILLA; Future  4. Encounter to establish care No previous PCP.   5. Encounter for lipid screening for cardiovascular disease Will check labs as below and f/u pending results. - Lipid Profile  6. Screening for diabetes mellitus Will check labs as below and f/u pending results. - HgB A1c  7. Class 3 severe obesity due to excess calories without serious comorbidity with body mass index (BMI) of 50.0 to 59.9 in adult [facility]) Counseled patient on healthy lifestyle modifications including dieting and exercise. Will check labs as below and f/u pending results. - Comprehensive Metabolic Panel (CMET) - Lipid Profile - HgB A1c  8. 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 Discussed ROM exercises for the forearm. Slowly work back into throwing and swinging a bat, but rest for the next week. Meloxicam for inflammation. Heat prn. Ice immediately after a game.  - meloxicam (MOBIC) 15 MG tablet; Take 1 tablet (15 mg total) by mouth daily.  Dispense: 30 tablet; Refill: 0       Margaretann Loveless, PA-C   [facility]

## 2017-07-22 NOTE — Patient Instructions (Signed)
Golfer's Elbow Golfer's elbow, also called medial epicondylitis, is a condition that results from inflammation of the strong bands of tissue (tendons) that attach your forearm muscles to the inside of your bone at the elbow. These tendons affect the muscles that bend the palm toward the wrist (flexion). This condition is called golfer's elbow because it is more common among people who constantly bend and twist their wrists, such as golfers. This injury usually results from overuse. Tendons also become less flexible with age. This condition causes elbow pain that may spread to your forearm and upper arm. The pain may get worse when you bend your wrist downward. What are the causes? This condition is an overuse injury that is caused by:  Repeatedly flexing, turning, or twisting your wrist.  Constantly gripping objects with your hands.  What increases the risk? This condition is more likely to develop in people who play golf or tennis or have jobs that require the constant use of their hands. This injury is more common among:  Carpenters.  Gardeners.  Musicians.  Bricklayers.  Typists.  What are the signs or symptoms? Symptoms of this condition include:  Pain near the inner elbow or forearm.  Reduced grip strength.  How is this diagnosed? This condition is diagnosed based on your symptoms, medical history, and physical exam. During the exam, your health care provider may test your grip strength and move your wrist to check for pain. You may also have an MRI to confirm the diagnosis, look for other issues, and check for tears in the ligaments, muscles, or tendons. How is this treated? Treatment for this condition includes:  Stopping all activities that make you bend or twist your wrist until your pain and other symptoms go away.  Icing your wrist to relieve pain.  Taking NSAIDs or getting corticosteroid injections to reduce pain and swelling.  Doing stretches, range-of-motion,  and strengthening exercises (physical therapy) as told by your health care provider.  In rare cases, surgery may be needed if your condition does not improve. Follow these instructions at home:  If directed, apply ice to the injured area. ? Put ice in a plastic bag. ? Place a towel between your skin and the bag. ? Leave the ice on for 20 minutes, 2-3 times a day.  Move your fingers often to avoid stiffness.  Raise (elevate) the injured area above the level of your heart while you are sitting or lying down.  Return to your normal activities as told by your health care provider. Ask your health care provider what activities are safe for you.  Do exercises as told by your health care provider.  Do not use tobacco products, including cigarettes, chewing tobacco, or e-cigarettes. If you need help quitting, ask your health care provider.  Take over-the-counter and prescription medicines only as told by your health care provider.  Keep all follow-up visits as told by your health care provider. This is important. How is this prevented?  Warm up and stretch before being active.  Cool down and stretch after being active.  Give your body time to rest between periods of activity.  Make sure to use equipment that fits you.  Be safe and responsible while being active to avoid falls.  Do at least 150 minutes of moderate-intensity exercise each week, such as brisk walking or water aerobics.  Maintain physical fitness, including: ? Strength. ? Flexibility. ? Cardiovascular fitness. ? Endurance.  Perform exercises to strengthen the forearm muscles.  Slow your golf   swing to reduce shock in the arm when making contact with the ball, if you play golf. Contact a health care provider if:  Your pain does not improve or it gets worse.  You notice numbness in your hand. Get help right away if:  Your pain is severe.  You cannot move your wrist. This information is not intended to replace  advice given to you by your health care provider. Make sure you discuss any questions you have with your health care provider. Document Released: 03/31/2005 Document Revised: 12/04/2015 Document Reviewed: 12/11/2014 Elsevier Interactive Patient Education  2018 [employer]vier [employer]  

## 2017-07-23 ENCOUNTER — Telehealth: Payer: Self-pay

## 2017-07-23 LAB — CBC WITH DIFFERENTIAL/PLATELET
BASOS ABS: 0 10*3/uL (ref 0.0–0.2)
Basos: 1 %
EOS (ABSOLUTE): 0.2 10*3/uL (ref 0.0–0.4)
Eos: 3 %
HEMOGLOBIN: 16.3 g/dL (ref 13.0–17.7)
Hematocrit: 47.5 % (ref 37.5–51.0)
IMMATURE GRANS (ABS): 0 10*3/uL (ref 0.0–0.1)
Immature Granulocytes: 1 %
LYMPHS: 34 %
Lymphocytes Absolute: 2.2 10*3/uL (ref 0.7–3.1)
MCH: 30.8 pg (ref 26.6–33.0)
MCHC: 34.3 g/dL (ref 31.5–35.7)
MCV: 90 fL (ref 79–97)
MONOCYTES: 10 %
Monocytes Absolute: 0.6 10*3/uL (ref 0.1–0.9)
Neutrophils Absolute: 3.4 10*3/uL (ref 1.4–7.0)
Neutrophils: 51 %
PLATELETS: 226 10*3/uL (ref 150–379)
RBC: 5.3 x10E6/uL (ref 4.14–5.80)
RDW: 13.2 % (ref 12.3–15.4)
WBC: 6.4 10*3/uL (ref 3.4–10.8)

## 2017-07-23 LAB — LIPID PANEL
CHOLESTEROL TOTAL: 171 mg/dL (ref 100–199)
Chol/HDL Ratio: 4.5 ratio (ref 0.0–5.0)
HDL: 38 mg/dL — ABNORMAL LOW (ref >39)
LDL Calculated: 95 mg/dL (ref 0–99)
Triglycerides: 188 mg/dL — ABNORMAL HIGH (ref 0–149)
VLDL CHOLESTEROL CAL: 38 mg/dL (ref 5–40)

## 2017-07-23 LAB — COMPREHENSIVE METABOLIC PANEL
ALBUMIN: 4.5 g/dL (ref 3.5–5.5)
ALK PHOS: 68 IU/L (ref 39–117)
ALT: 56 IU/L — ABNORMAL HIGH (ref 0–44)
AST: 39 IU/L (ref 0–40)
Albumin/Globulin Ratio: 1.7 (ref 1.2–2.2)
BUN / CREAT RATIO: 22 — AB (ref 9–20)
BUN: 18 mg/dL (ref 6–24)
Bilirubin Total: 0.4 mg/dL (ref 0.0–1.2)
CO2: 22 mmol/L (ref 20–29)
CREATININE: 0.83 mg/dL (ref 0.76–1.27)
Calcium: 9.6 mg/dL (ref 8.7–10.2)
Chloride: 102 mmol/L (ref 96–106)
GFR calc Af Amer: 126 mL/min/{1.73_m2} (ref >59)
GFR calc non Af Amer: 109 mL/min/{1.73_m2} (ref >59)
GLUCOSE: 109 mg/dL — AB (ref 65–99)
Globulin, Total: 2.7 g/dL (ref 1.5–4.5)
Potassium: 4.4 mmol/L (ref 3.5–5.2)
Sodium: 140 mmol/L (ref 134–144)
TOTAL PROTEIN: 7.2 g/dL (ref 6.0–8.5)

## 2017-07-23 LAB — HEMOGLOBIN A1C
Est. average glucose Bld gHb Est-mCnc: 140 mg/dL
HEMOGLOBIN A1C: 6.5 % — AB (ref 4.8–5.6)

## 2017-07-23 LAB — TSH: TSH: 2.68 u[IU]/mL (ref 0.450–4.500)

## 2017-07-23 NOTE — Telephone Encounter (Signed)
Patient viewed message and lab results in Viewed by Mauricia Areaaniel Wayne Laprise on 07/23/2017 12:58 PM   Thanks,  -Joseiline

## 2017-07-23 NOTE — Telephone Encounter (Signed)
-----   Message from Margaretann LovelessJennifer M Burnette, [place]-C sent at 07/23/2017 12:47 PM EDT ----- Recommend rechecking in 3-6 months.

## 2017-07-23 NOTE — Telephone Encounter (Signed)
-----   Message from Margaretann LovelessJennifer M Burnette, [place]-C sent at 07/23/2017 12:46 PM EDT ----- Cholesterol is normal but triglycerides are borderline high at 188 (would like at or below 150). This reading of cholesterol is most closely related to dietary habits. Also one portion of liver enzymes is slightly elevated as well. Again most often related to dietary habits. A1c is also at 6.5 which is indicative of diabetes. I would recommend really working on lifestyle modifications with dieting (limiting carbs, sugars, red meats and fatty foods) and increasing physical activity to try to get 150 minutes of moderate exercise per week. Also limit alcohol consumption and tylenol (acetaminophen) based products for the liver function. Thyroid is normal. Blood count is normal. Kidney function is normal.

## 2017-07-24 ENCOUNTER — Telehealth: Payer: Self-pay | Admitting: Physician Assistant

## 2017-07-24 DIAGNOSIS — N6320 Unspecified lump in the left breast, unspecified quadrant: Secondary | ICD-10-CM

## 2017-07-24 NOTE — Telephone Encounter (Signed)
error 

## 2017-07-24 NOTE — Telephone Encounter (Signed)
ordered

## 2017-07-24 NOTE — Addendum Note (Signed)
Addended by: Margaretann LovelessBURNETTE, Archibald Marchetta M on: 07/24/2017 01:32 PM   Modules accepted: Orders

## 2017-07-24 NOTE — Telephone Encounter (Signed)
Keith Fox is requesting an order for diagnostic bilateral mammogram TOMO [id number] and right breast ultrasound limited [id number]

## 2017-08-04 ENCOUNTER — Ambulatory Visit
Admission: RE | Admit: 2017-08-04 | Discharge: 2017-08-04 | Disposition: A | Discharge status: Home / Self Care | Payer: No Typology Code available for payment source | Source: Ambulatory Visit | Attending: Physician Assistant | Admitting: Physician Assistant

## 2017-08-04 DIAGNOSIS — N62 Hypertrophy of breast: Secondary | ICD-10-CM | POA: Insufficient documentation

## 2017-08-04 DIAGNOSIS — N6322 Unspecified lump in the left breast, upper inner quadrant: Secondary | ICD-10-CM | POA: Insufficient documentation

## 2017-08-04 DIAGNOSIS — N6320 Unspecified lump in the left breast, unspecified quadrant: Secondary | ICD-10-CM | POA: Diagnosis present

## 2017-08-04 DIAGNOSIS — N6324 Unspecified lump in the left breast, lower inner quadrant: Secondary | ICD-10-CM | POA: Insufficient documentation

## 2017-10-26 ENCOUNTER — Emergency Department
Admission: EM | Admit: 2017-10-26 | Discharge: 2017-10-26 | Disposition: A | Discharge status: Home / Self Care | Payer: No Typology Code available for payment source | Attending: Emergency Medicine | Admitting: Emergency Medicine

## 2017-10-26 ENCOUNTER — Emergency Department: Payer: No Typology Code available for payment source

## 2017-10-26 ENCOUNTER — Other Ambulatory Visit: Payer: Self-pay

## 2017-10-26 ENCOUNTER — Encounter: Payer: Self-pay | Admitting: Emergency Medicine

## 2017-10-26 DIAGNOSIS — M25562 Pain in left knee: Secondary | ICD-10-CM | POA: Diagnosis not present

## 2017-10-26 DIAGNOSIS — F17220 Nicotine dependence, chewing tobacco, uncomplicated: Secondary | ICD-10-CM | POA: Insufficient documentation

## 2017-10-26 DIAGNOSIS — N611 Abscess of the breast and nipple: Secondary | ICD-10-CM | POA: Insufficient documentation

## 2017-10-26 DIAGNOSIS — L0291 Cutaneous abscess, unspecified: Secondary | ICD-10-CM | POA: Diagnosis present

## 2017-10-26 LAB — BASIC METABOLIC PANEL
Anion gap: 9 (ref 5–15)
BUN: 20 mg/dL (ref 6–20)
CO2: 23 mmol/L (ref 22–32)
Calcium: 9.3 mg/dL (ref 8.9–10.3)
Chloride: 107 mmol/L (ref 98–111)
Creatinine, Ser: 0.91 mg/dL (ref 0.61–1.24)
GFR calc Af Amer: >60 mL/min (ref >60)
GFR calc non Af Amer: >60 mL/min (ref >60)
Glucose, Bld: 231 mg/dL — ABNORMAL HIGH (ref 70–99)
Potassium: 3.3 mmol/L — ABNORMAL LOW (ref 3.5–5.1)
Sodium: 139 mmol/L (ref 135–145)

## 2017-10-26 LAB — CBC WITH DIFFERENTIAL/PLATELET
Basophils Absolute: 0.1 10*3/uL (ref 0–0.1)
Basophils Relative: 1 %
Eosinophils Absolute: 0.4 10*3/uL (ref 0–0.7)
Eosinophils Relative: 4 %
HCT: 44.3 % (ref 40.0–52.0)
Hemoglobin: 15.7 g/dL (ref 13.0–18.0)
Lymphocytes Relative: 29 %
Lymphs Abs: 2.6 10*3/uL (ref 1.0–3.6)
MCH: 31.2 pg (ref 26.0–34.0)
MCHC: 35.5 g/dL (ref 32.0–36.0)
MCV: 87.7 fL (ref 80.0–100.0)
Monocytes Absolute: 1.1 10*3/uL — ABNORMAL HIGH (ref 0.2–1.0)
Monocytes Relative: 12 %
Neutro Abs: 4.8 10*3/uL (ref 1.4–6.5)
Neutrophils Relative %: 54 %
Platelets: 226 10*3/uL (ref 150–440)
RBC: 5.05 MIL/uL (ref 4.40–5.90)
RDW: 13.2 % (ref 11.5–14.5)
WBC: 8.9 10*3/uL (ref 3.8–10.6)

## 2017-10-26 MED ORDER — CLINDAMYCIN HCL 300 MG PO CAPS: 300.0000 mg | ORAL_CAPSULE | Freq: Three times a day (TID) | ORAL | 0 refills | Status: AC

## 2017-10-26 MED ORDER — CLINDAMYCIN HCL 150 MG PO CAPS
300.0000 mg | ORAL_CAPSULE | Freq: Once | ORAL | Status: AC
  Administered 2017-10-26: 300 mg via ORAL
  Filled 2017-10-26: qty 2

## 2017-10-26 MED ORDER — LIDOCAINE HCL (PF) 1 % IJ SOLN
INTRAMUSCULAR | Status: AC
Start: 2017-10-26 — End: 2017-10-26
  Administered 2017-10-26: 5 mL
  Filled 2017-10-26: qty 5

## 2017-10-26 MED ORDER — LIDOCAINE 5 % EX PTCH
MEDICATED_PATCH | CUTANEOUS | Status: AC
  Filled 2017-10-26: qty 1

## 2017-10-26 MED ORDER — LIDOCAINE HCL 1 % IJ SOLN
5.0000 mL | Freq: Once | INTRAMUSCULAR | Status: AC
  Administered 2017-10-26: 5 mL

## 2017-10-26 MED ORDER — MELOXICAM 15 MG PO TABS: 15.0000 mg | ORAL_TABLET | Freq: Every day | ORAL | 1 refills | Status: AC

## 2017-10-26 NOTE — ED Notes (Signed)
Patient transported to Ultrasound 

## 2017-10-26 NOTE — ED Notes (Signed)
Pt reports pain to left knee that began about 3 days ago, pt denies inj to knee states he has had swelling that comes and goes to right knee. Pt states pain when he bends it or put pressure on it for a while.  Pt also reports red hard bump to left pectoral area, no drainage noted. Pt states he has had this occur before. Pt states at this time it itches and is painful on palpation. Pt states he use to have his nipples pierced but took them out "a while ago." Redness and lump noted at site.

## 2017-10-26 NOTE — ED Triage Notes (Signed)
Pt reports pain to left knee for about 3 days,  Reports gradually getting worse, denies injury, pt also reports redness and swelling to left pectoral area near nipple area, redness noted area it is warm to touch, pt talks in complete sentences no distress noted

## 2017-10-26 NOTE — ED Provider Notes (Signed)
[facility] Emergency Department Provider Note  ____________________________________________  Time seen: Approximately 10:00 PM  I have reviewed the triage vital signs and the nursing notes.   HISTORY  Chief Complaint Knee Pain and Abscess (left pectoral area )    HPI Keith Fox is a 42 y.o. male presents to the emergency department with 2 complaints.  Patient reports for the past 2 days, he has had redness and hardness surrounding his left nipple.  Patient reports that if he "squeezes his nipple hard enough", he can express purulent exudate.  Patient reports that he has experienced similar symptoms in the past intermittently but seemed to resolve without any type of intervention.  Patient reports that he had a mammogram one year ago which was reassuring.  Patient reports that he had a nipple ring placed 6 years ago that he took out himself.  Patient never had breast issues prior to placement of nipple ring.  He denies fever and chills.  He secondarily reports 7 out of 10 aching medial left knee pain that is worsened with ambulation and relieved with rest.  He denies falls or mechanisms of trauma.  No prior left knee surgeries.  No alleviating measures of been attempted.   History reviewed. No pertinent past medical history.  Patient Active Problem List   Diagnosis Date Noted  . Allergic rhinitis 07/22/2017  . Class 3 severe obesity due to excess calories without serious comorbidity with body mass index (BMI) of 50.0 to 59.9 in adult [facility](HCC) 07/22/2017    Past Surgical History:  Procedure Laterality Date  . OTHER SURGICAL HISTORY     had ulcer removed from small intestines    Prior to Admission medications   Medication Sig Start Date End Date Taking? Authorizing Provider  meloxicam (MOBIC) 15 MG tablet Take 1 tablet (15 mg total) by mouth daily. 07/22/17   Margaretann LovelessBurnette, Jennifer M, PA-C    Allergies Patient has no known allergies.  Family History   Problem Relation Age of Onset  . Parkinson's disease Father   . Breast cancer Neg Hx     Social History Social History   Tobacco Use  . Smoking status: Never Smoker  . Smokeless tobacco: Current User    Types: Chew  Substance Use Topics  . Alcohol use: Yes    Alcohol/week: 1.8 oz    Types: 3 Cans of beer per week  . Drug use: No     Review of Systems  Constitutional: No fever/chills Eyes: No visual changes. No discharge ENT: No upper respiratory complaints. Cardiovascular: no chest pain. Respiratory: no cough. No SOB. Gastrointestinal: No abdominal pain.  No nausea, no vomiting.  No diarrhea.  No constipation. Musculoskeletal: Patient has left knee pain.  Skin: Patient has erythema and induration of left breast. Neurological: Negative for headaches, focal weakness or numbness.   ____________________________________________   PHYSICAL EXAM:  VITAL SIGNS: ED Triage Vitals  Enc Vitals Group     BP 10/26/17 2029 (!) 169/78     Pulse Rate 10/26/17 2029 90     Resp 10/26/17 2029 20     Temp 10/26/17 2029 98.6 F (37 C)     Temp Source 10/26/17 2029 Oral     SpO2 10/26/17 2029 94 %     Weight 10/26/17 2030 (!) 350 lb (158.8 kg)     Height 10/26/17 2030 5\' 11"  (1.803 m)     Head Circumference --      Peak Flow --  Pain Score 10/26/17 2029 6     Pain Loc --      Pain Edu? --      Excl. in GC? --      Constitutional: Alert and oriented. Well appearing and in no acute distress. Eyes: Conjunctivae are normal. PERRL. EOMI. Head: Atraumatic. ENT:      Ears: TMs are pearly.      Nose: No congestion/rhinnorhea.      Mouth/Throat: Mucous membranes are moist.  Neck: No stridor.  No cervical spine tenderness to palpation. Cardiovascular: Normal rate, regular rhythm. Normal S1 and S2.  Good peripheral circulation. Respiratory: Normal respiratory effort without tachypnea or retractions. Lungs CTAB. Good air entry to the bases with no decreased or absent breath  sounds. Gastrointestinal: Bowel sounds 4 quadrants. Soft and nontender to palpation. No guarding or rigidity. No palpable masses. No distention. No CVA tenderness. Musculoskeletal: Full range of motion to all extremities. No gross deformities appreciated.  Peripatellar dimpling visualized, left.  Negative ballottement, left No popliteal cyst palpated, left.  Negative anterior and posterior drawer test, left.  Positive McMurray, left.  Patient has tenderness elicited with palpation over the medial aspect of the knee, left.  Palpable dorsalis pedis pulse, left. Neurologic:  Normal speech and language. No gross focal neurologic deficits are appreciated.  Skin: Patient has erythema and induration surrounding the left areola.  Erythema and induration predominate on the medial side between the 7:00 and 11 o'clock position.    ____________________________________________   LABS (all labs ordered are listed, but only abnormal results are displayed)  Labs Reviewed  CBC WITH DIFFERENTIAL/PLATELET  BASIC METABOLIC PANEL   ____________________________________________  EKG   ____________________________________________  RADIOLOGY I personally viewed and evaluated these images as part of my medical decision making, as well as reviewing the written report by the radiologist.  Dg Knee Complete 4 Views Left  Result Date: 10/26/2017 CLINICAL DATA:  Left knee pain and swelling for the past 3 days. No known injury. EXAM: LEFT KNEE - COMPLETE 4+ VIEW COMPARISON:  06/03/2015 FINDINGS: No fracture or dislocation. 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 No evidence of chondrocalcinosis. No joint effusion. Regional soft tissues appear normal. IMPRESSION: 1. No acute findings. 2. 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 Electronically Signed   By: Simonne Come M.D.   On: 10/26/2017 20:56    ____________________________________________    PROCEDURES  Procedure(s) performed:    Procedures INCISION AND DRAINAGE Performed by: Orvil Feil Consent: Verbal consent obtained. Risks and benefits: risks, benefits and alternatives were discussed Type: abscess  Body area: Left breast  Anesthesia: local infiltration using 1% lidocaine  Incision was made with an 18-gauge needle  Local anesthetic: lidocaine 1% without epinephrine  Anesthetic total: 2 ml  Complexity: complex Blunt dissection to break up loculations  Drainage: purulent  Drainage amount: Small  Patient tolerance: Patient tolerated the procedure well with no immediate complications.      Medications - No data to display   ____________________________________________   INITIAL IMPRESSION / ASSESSMENT AND PLAN / ED COURSE  Pertinent labs & imaging results that were available during my care of the patient were reviewed by me and considered in my medical decision making (see chart for details).  Review of the [place] CSRS was performed in accordance of the [facility] prior to  dispensing any controlled drugs.      Assessment and plan Breast abscess Patient presents to the emergency department with erythema and induration surrounding left areola for the past two days.  An ultrasound was obtained which revealed a complex fluid collection consistent with an abscess.  Dr. Satira Mccallum (general surgery) was consulted regarding patient's case.  Dr. Earlene Plater recommended bedside needle aspiration using ultrasound by ED provider.  Using bedside ultrasound, needle aspiration occurred in the emergency department which yielded a small amount of purulent exudate.  I suspect that patient still has residual abscess that did not drain in the emergency department tonight.   Patient was started empirically on clindamycin and referred to Dr. Satira Mccallum for follow-up and further evaluation.  Vital signs were reassuring prior to discharge.  White blood cell count was within reference range in the emergency department.  All patient questions were answered prior to discharge.  For left knee pain, x-ray examination of the left knee occurred.  Tricompartmental arthritis was visualized.  Patient was discharged with meloxicam and advised to follow up with ortho.    ____________________________________________  FINAL CLINICAL IMPRESSION(S) / ED DIAGNOSES  Final diagnoses:  Breast abscess      NEW MEDICATIONS STARTED DURING THIS VISIT:  ED Discharge Orders    None          This chart was dictated using voice recognition software/Dragon. Despite best efforts to proofread, errors can occur which can change the meaning. Any change was purely unintentional.    Orvil Feil, PA-C 10/26/17 2349    Phineas Semen, MD 10/27/17 [phone]

## 2018-01-06 ENCOUNTER — Other Ambulatory Visit: Payer: Self-pay

## 2018-01-06 ENCOUNTER — Emergency Department (HOSPITAL_COMMUNITY)
Admission: EM | Admit: 2018-01-06 | Discharge: 2018-01-06 | Disposition: A | Discharge status: Home / Self Care | Payer: No Typology Code available for payment source | Attending: Emergency Medicine | Admitting: Emergency Medicine

## 2018-01-06 DIAGNOSIS — F17220 Nicotine dependence, chewing tobacco, uncomplicated: Secondary | ICD-10-CM | POA: Insufficient documentation

## 2018-01-06 DIAGNOSIS — N611 Abscess of the breast and nipple: Secondary | ICD-10-CM

## 2018-01-06 DIAGNOSIS — L02213 Cutaneous abscess of chest wall: Secondary | ICD-10-CM | POA: Diagnosis not present

## 2018-01-06 MED ORDER — SULFAMETHOXAZOLE-TRIMETHOPRIM 800-160 MG PO TABS: 1.0000 | ORAL_TABLET | Freq: Two times a day (BID) | ORAL | 0 refills | Status: AC

## 2018-01-06 MED ORDER — LIDOCAINE-EPINEPHRINE (PF) 2 %-1:200000 IJ SOLN
20.0000 mL | Freq: Once | INTRAMUSCULAR | Status: AC
  Administered 2018-01-06: 20 mL via INTRADERMAL
  Filled 2018-01-06: qty 20

## 2018-01-06 MED ORDER — OXYCODONE-ACETAMINOPHEN 5-325 MG PO TABS: 1.0000 | ORAL_TABLET | ORAL | 0 refills | Status: DC | PRN

## 2018-01-06 MED ORDER — SULFAMETHOXAZOLE-TRIMETHOPRIM 800-160 MG PO TABS
1.0000 | ORAL_TABLET | Freq: Once | ORAL | Status: AC
  Administered 2018-01-06: 1 via ORAL
  Filled 2018-01-06: qty 1

## 2018-01-06 MED ORDER — OXYCODONE-ACETAMINOPHEN 5-325 MG PO TABS
1.0000 | ORAL_TABLET | Freq: Once | ORAL | Status: AC
  Administered 2018-01-06: 1 via ORAL
  Filled 2018-01-06: qty 1

## 2018-01-06 NOTE — ED Provider Notes (Signed)
Medical screening examination/treatment/procedure(s) were conducted as a shared visit with non-physician practitioner(s) and myself.  I personally evaluated the patient during the encounter.  None Patient was redness and swelling adjacent to left nipple.  He has had prior cellulitis in the area.  Patient has been seen in the breast center and has not had any cancer diagnosis.  Patient has area of tender erythema surrounding the left nipple and extending onto the soft tissues of the breast.  Ultrasound shows large fluid collection.  I recommend proceeding with incision and drainage.  I agree with plan and management.  Patient will be rescheduling with breast center for reevaluation.   Arby Barrette, MD 01/06/18 [phone]

## 2018-01-06 NOTE — ED Notes (Signed)
ID at bed side.

## 2018-01-06 NOTE — ED Provider Notes (Signed)
[facility]-EMERGENCY DEPT Provider Note   CSN: [id number] Arrival date & time: 01/06/18  1741     History   Chief Complaint Chief Complaint  Patient presents with  . Abscess    HPI Keith Fox is a 42 y.o. male.  HPI   Keith Fox is a 42 year old male with a history of obesity and left breast cyst who presents to the emergency department for evaluation of erythema, pain and swelling over the left breast.  Patient reports that pain and swelling started a week ago and has gotten progressively worse.  He reports pain is 6/10 in severity and feels throbbing, worsened with palpation.  No drainage from the site.  He has a history of similar symptoms a few months ago and was referred to the breast center.  He was told that he has cysts, no cancer.  Has never required I&D.  He denies fevers, chills, abdominal pain, vomiting.  No past medical history on file.  Patient Active Problem List   Diagnosis Date Noted  . Allergic rhinitis 07/22/2017  . Class 3 severe obesity due to excess calories without serious comorbidity with body mass index (BMI) of 50.0 to 59.9 in adult [facility]) 07/22/2017    Past Surgical History:  Procedure Laterality Date  . OTHER SURGICAL HISTORY     had ulcer removed from small intestines        Home Medications    Prior to Admission medications   Not on File    Family History Family History  Problem Relation Age of Onset  . Parkinson's disease Father   . Breast cancer Neg Hx     Social History Social History   Tobacco Use  . Smoking status: Never Smoker  . Smokeless tobacco: Current User    Types: Chew  Substance Use Topics  . Alcohol use: Yes    Alcohol/week: 3.0 standard drinks    Types: 3 Cans of beer per week  . Drug use: No     Allergies   Patient has no known allergies.   Review of Systems Review of Systems  Constitutional: Negative for chills and fever.  Gastrointestinal: Negative for  abdominal pain, nausea and vomiting.  Skin: Positive for color change.     Physical Exam Updated Vital Signs BP (!) 160/86 (BP Location: Left Arm)   Pulse 78   Temp 98.1 F (36.7 C) (Oral)   Resp 18   Wt (!) 158.8 kg   SpO2 98%   BMI 48.82 kg/m   Physical Exam  Constitutional: He appears well-developed and well-nourished. No distress.  HENT:  Head: Normocephalic and atraumatic.  Eyes: Right eye exhibits no discharge. Left eye exhibits no discharge.  Pulmonary/Chest: Effort normal. No respiratory distress.  6 cm x 12 cm area of erythema, induration and warmth over the left breast tissue.  There is overlying pain.    Neurological: He is alert. Coordination normal.  Skin: He is not diaphoretic.  Psychiatric: He has a normal mood and affect. His behavior is normal.  Nursing note and vitals reviewed.    ED Treatments / Results  Labs (all labs ordered are listed, but only abnormal results are displayed) Labs Reviewed - No data to display  EKG None  Radiology No results found.  Procedures .Marland KitchenIncision and Drainage Date/Time: 01/06/2018 8:48 PM Performed by: Kellie Shropshire, PA-C Authorized by: Kellie Shropshire, PA-C   Consent:    Consent obtained:  Verbal   Consent given by:  Patient  Risks discussed:  Bleeding, incomplete drainage, infection and pain   Alternatives discussed:  No treatment and delayed treatment Location:    Type:  Abscess   Size:  3cm   Location:  Trunk   Trunk location:  L breast Pre-procedure details:    Skin preparation:  Betadine Anesthesia (see MAR for exact dosages):    Anesthesia method:  Local infiltration   Local anesthetic:  Lidocaine 2% WITH epi Procedure type:    Complexity:  Complex Procedure details:    Incision types:  Elliptical   Scalpel blade:  11   Wound management:  Probed and deloculated   Drainage:  Purulent   Drainage amount:  Copious   Packing materials:  1/4 in iodoform gauze Post-procedure details:     Patient tolerance of procedure:  Tolerated well, no immediate complications   (including critical care time)  EMERGENCY DEPARTMENT US SOFT TISSUE INTERPRETATION "Study: Limited Soft Tissue Ultrasound"  INDICATIONS: Pain and Soft tissue infection Multiple views of the body part were obtained in real-time with a multi-frequency linear probe  PERFORMED BY: Myself IMAGES ARCHIVED?: Yes SIDE:Left BODY PART:Breast INTERPRETATION:  Abcess present and Cellulitis present   Medications Ordered in ED Medications  sulfamethoxazole-trimethoprim (BACTRIM DS,SEPTRA DS) 800-160 MG per tablet 1 tablet (has no administration in time range)  lidocaine-EPINEPHrine (XYLOCAINE W/EPI) 2 %-1:200000 (PF) injection 20 mL (20 mLs Intradermal Given by Other 01/06/18 2040)  oxyCODONE-acetaminophen (PERCOCET/ROXICET) 5-325 MG per tablet 1 tablet (1 tablet Oral Given 01/06/18 1944)     Initial Impression / Assessment and Plan / ED Course  I have reviewed the triage vital signs and the nursing notes.  Pertinent labs & imaging results that were available during my care of the patient were reviewed by me and considered in my medical decision making (see chart for details).     Patient with breast abscess and surrounding cellulitis. Has hx of left breast cyst and followed by breast center. No hx of breast cancer. The abscess was amenable to incision and drainage.  1/4 in isoform gauze used to pack the incision. Patient counseled to return for wound recheck and packing removal in 2 days. Will d/c with bactrim. Encouraged home warm soaks and flushing. Counseled him to follow up with the breast clinic regarding today's ER visit. Patient agrees and appears reliable.    Final Clinical Impressions(s) / ED Diagnoses   Final diagnoses:  Breast abscess in male    ED Discharge Orders         Ordered    sulfamethoxazole-trimethoprim (BACTRIM DS,SEPTRA DS) 800-160 MG tablet  2 times daily     01/06/18 2052             Lawrence MarseillesShrosbree, Emily J, PA-C 01/06/18 2054    Arby BarrettePfeiffer, Marcy, MD 01/06/18 2152

## 2018-01-06 NOTE — ED Triage Notes (Signed)
Pt arrives via POV. Pt reports that he has cysts that develops on left breast. Pt has had an I&D before to the area. Pt reports redness and swelling worsening over the last week. Redness and swelling noted. No drainage noted. Pt denies fevers.

## 2018-01-06 NOTE — Discharge Instructions (Signed)
Come back to the ER in two days to have the packing removed and to recheck the wound. Return sooner if fever or worsening redness and swelling.   Take antibiotic until completely gone.   Follow up at the breast clinic.

## 2018-01-08 ENCOUNTER — Other Ambulatory Visit: Payer: Self-pay

## 2018-01-08 ENCOUNTER — Emergency Department (HOSPITAL_COMMUNITY)
Admission: EM | Admit: 2018-01-08 | Discharge: 2018-01-08 | Disposition: A | Discharge status: Home / Self Care | Payer: PRIVATE HEALTH INSURANCE | Attending: Emergency Medicine | Admitting: Emergency Medicine

## 2018-01-08 ENCOUNTER — Encounter (HOSPITAL_COMMUNITY): Payer: Self-pay | Admitting: *Deleted

## 2018-01-08 DIAGNOSIS — Z4801 Encounter for change or removal of surgical wound dressing: Secondary | ICD-10-CM | POA: Insufficient documentation

## 2018-01-08 DIAGNOSIS — Z5189 Encounter for other specified aftercare: Secondary | ICD-10-CM

## 2018-01-08 NOTE — Discharge Instructions (Signed)
Please return for any problem. Follow up with your regular physician as instructed.  °

## 2018-01-08 NOTE — ED Triage Notes (Signed)
Pt needs packing removed from left chest. Pt had abscess drained 2 days ago. Pt denies any other complaint.

## 2018-01-08 NOTE — ED Provider Notes (Signed)
[facility]-EMERGENCY DEPT Provider Note   CSN: [id number] Arrival date & time: 01/08/18  1027     History   Chief Complaint Chief Complaint  Patient presents with  . Wound Check    HPI Keith Fox is a 41 y.o. male.  42 year old male with prior medical history as documented below presents for wound check.  Patient reports I&D of his left breast abscess 2 days prior.  Patient was told to report to the ED for a wound check and packing removal today.  He reports that the area of infection is improved.  He reports less swelling.  He reports less pain.  He is compliant with his outpatient antibiotic prescription.  He denies any other acute complaint.  The history is provided by the patient and medical records.  Wound Check  This is a new problem. The current episode started more than 1 week ago. The problem occurs rarely. The problem has been rapidly improving. Pertinent negatives include no chest pain, no abdominal pain, no headaches and no shortness of breath. Nothing aggravates the symptoms. Nothing relieves the symptoms.    History reviewed. No pertinent past medical history.  Patient Active Problem List   Diagnosis Date Noted  . Allergic rhinitis 07/22/2017  . Class 3 severe obesity due to excess calories without serious comorbidity with body mass index (BMI) of 50.0 to 59.9 in adult [facility]) 07/22/2017    Past Surgical History:  Procedure Laterality Date  . OTHER SURGICAL HISTORY     had ulcer removed from small intestines        Home Medications    Prior to Admission medications   Medication Sig Start Date End Date Taking? Authorizing Provider  oxyCODONE-acetaminophen (PERCOCET/ROXICET) 5-325 MG tablet Take 1 tablet by mouth every 4 (four) hours as needed for severe pain. 01/06/18  Yes Kellie Shropshire, PA-C  sulfamethoxazole-trimethoprim (BACTRIM DS,SEPTRA DS) 800-160 MG tablet Take 1 tablet by mouth 2 (two) times daily for 7 days.  01/06/18 01/13/18 Yes Kellie Shropshire, PA-C    Family History Family History  Problem Relation Age of Onset  . Parkinson's disease Father   . Breast cancer Neg Hx     Social History Social History   Tobacco Use  . Smoking status: Never Smoker  . Smokeless tobacco: Current User    Types: Chew  Substance Use Topics  . Alcohol use: Yes    Alcohol/week: 3.0 standard drinks    Types: 3 Cans of beer per week  . Drug use: No     Allergies   Patient has no known allergies.   Review of Systems Review of Systems  Respiratory: Negative for shortness of breath.   Cardiovascular: Negative for chest pain.  Gastrointestinal: Negative for abdominal pain.  Neurological: Negative for headaches.  All other systems reviewed and are negative.    Physical Exam Updated Vital Signs BP (!) 142/83   Pulse 78   Temp 97.6 F (36.4 C) (Oral)   Resp 18   SpO2 98%   Physical Exam  Constitutional: He is oriented to person, place, and time. He appears well-developed and well-nourished. No distress.  HENT:  Head: Normocephalic and atraumatic.  Mouth/Throat: Oropharynx is clear and moist.  Eyes: Pupils are equal, round, and reactive to light. Conjunctivae and EOM are normal.  Neck: Normal range of motion.  Pulmonary/Chest: No respiratory distress.  Abdominal: He exhibits no distension. There is no tenderness.  Musculoskeletal: Normal range of motion. He exhibits no edema or  deformity.  Neurological: He is alert and oriented to person, place, and time.  Skin: Skin is warm and dry.  Area around the left nipple is minimally erythematous.  There is no visible discharge from the I&D site.  Packing is in place.  Per patient report, the wound is significantly improved over the last 48 hours.  Psychiatric: He has a normal mood and affect.  Nursing note and vitals reviewed.    ED Treatments / Results  Labs (all labs ordered are listed, but only abnormal results are displayed) Labs Reviewed  - No data to display  EKG None  Radiology No results found.  Procedures Procedures (including critical care time)  Medications Ordered in ED Medications - No data to display   Initial Impression / Assessment and Plan / ED Course  I have reviewed the triage vital signs and the nursing notes.  Pertinent labs & imaging results that were available during my care of the patient were reviewed by me and considered in my medical decision making (see chart for details).     MDM  Screen complete  Patient is presenting today for a wound check and packing removal status post a recent I&D.  Patient is without significant acute complaint at this time.  He reports improvement in his symptoms following the I&D.  Packing removed without difficulty.    Patient understands need for close follow-up.  Strict return precautions given and understood.    Final Clinical Impressions(s) / ED Diagnoses   Final diagnoses:  Visit for wound check    ED Discharge Orders    None       Wynetta Fines, MD 01/08/18 1137

## 2018-04-05 ENCOUNTER — Encounter: Payer: Self-pay | Admitting: Emergency Medicine

## 2018-04-05 ENCOUNTER — Other Ambulatory Visit: Payer: Self-pay

## 2018-04-05 ENCOUNTER — Emergency Department
Admission: EM | Admit: 2018-04-05 | Discharge: 2018-04-05 | Disposition: A | Discharge status: Home / Self Care | Payer: No Typology Code available for payment source | Attending: Emergency Medicine | Admitting: Emergency Medicine

## 2018-04-05 DIAGNOSIS — Z79899 Other long term (current) drug therapy: Secondary | ICD-10-CM | POA: Insufficient documentation

## 2018-04-05 DIAGNOSIS — M545 Low back pain: Secondary | ICD-10-CM | POA: Diagnosis not present

## 2018-04-05 DIAGNOSIS — M62830 Muscle spasm of back: Secondary | ICD-10-CM

## 2018-04-05 DIAGNOSIS — G8929 Other chronic pain: Secondary | ICD-10-CM

## 2018-04-05 MED ORDER — METHOCARBAMOL 500 MG PO TABS: ORAL_TABLET | ORAL | 0 refills | Status: DC

## 2018-04-05 MED ORDER — PREDNISONE 10 MG PO TABS: ORAL_TABLET | ORAL | 0 refills | Status: DC

## 2018-04-05 MED ORDER — ORPHENADRINE CITRATE 30 MG/ML IJ SOLN
60.0000 mg | Freq: Two times a day (BID) | INTRAMUSCULAR | Status: DC
  Administered 2018-04-05: 60 mg via INTRAMUSCULAR
  Filled 2018-04-05: qty 2

## 2018-04-05 MED ORDER — HYDROCODONE-ACETAMINOPHEN 5-325 MG PO TABS
2.0000 | ORAL_TABLET | Freq: Once | ORAL | Status: AC
  Administered 2018-04-05: 2 via ORAL
  Filled 2018-04-05: qty 2

## 2018-04-05 NOTE — Discharge Instructions (Addendum)
Follow-up with your primary care provider and consider making an appointment with Dr. Rexanne ManoMintz who is the orthopedist on call.  Begin taking medication as directed.  Prednisone is for the next 6 days.  Robaxin is a muscle relaxant and could cause drowsiness.  You may take 1 or 2 tablets every 6 hours as needed for spasms.  Moist heat or ice to your back as needed for discomfort.  Also have your blood pressure checked at your primary care provider's office.  Your blood pressure initially in the ED was 163/101.

## 2018-04-05 NOTE — ED Provider Notes (Signed)
[facility] Emergency Department Provider Note  ____________________________________________   First MD Initiated Contact with Patient 04/05/18 1339     (approximate)  I have reviewed the triage vital signs and the nursing notes.   HISTORY  Chief Complaint Back Pain   HPI Keith Fox is a 42 y.o. male presents to the ED with complaint of acute exacerbation of his chronic low back pain.  Patient states he has suffered with low back pain for last 10 years.  He states that last evening he was bending over and felt a "pop".  He denies any numbness or tingling into his lower extremities.  He denies any saddle anesthesias or incontinence of bowel or bladder.  Patient has been seen in the ED for his back in the past but has never followed up with a orthopedist.  Currently he complains of muscle spasms with movement.  He rates his pain as a 9/10.   History reviewed. No pertinent past medical history.  Patient Active Problem List   Diagnosis Date Noted  . Allergic rhinitis 07/22/2017  . Class 3 severe obesity due to excess calories without serious comorbidity with body mass index (BMI) of 50.0 to 59.9 in adult [facility]) 07/22/2017    Past Surgical History:  Procedure Laterality Date  . OTHER SURGICAL HISTORY     had ulcer removed from small intestines    Prior to Admission medications   Medication Sig Start Date End Date Taking? Authorizing Provider  methocarbamol (ROBAXIN) 500 MG tablet 1-2 tablets every 6 hours prn muscle spasms 04/05/18   Tommi RumpsSummers, Rhonda L, PA-C  oxyCODONE-acetaminophen (PERCOCET/ROXICET) 5-325 MG tablet Take 1 tablet by mouth every 4 (four) hours as needed for severe pain. 01/06/18   Kellie ShropshireShrosbree, Emily J, PA-C  predniSONE (DELTASONE) 10 MG tablet Take 6 tablets  today, on day 2 take 5 tablets, day 3 take 4 tablets, day 4 take 3 tablets, day 5 take  2 tablets and 1 tablet the last day 04/05/18   Tommi RumpsSummers, Rhonda L, PA-C     Allergies Patient has no known allergies.  Family History  Problem Relation Age of Onset  . Parkinson's disease Father   . Breast cancer Neg Hx     Social History Social History   Tobacco Use  . Smoking status: Never Smoker  . Smokeless tobacco: Current User    Types: Chew  Substance Use Topics  . Alcohol use: Yes    Alcohol/week: 3.0 standard drinks    Types: 3 Cans of beer per week  . Drug use: No    Review of Systems Constitutional: No fever/chills Cardiovascular: Denies chest pain. Respiratory: Denies shortness of breath. Gastrointestinal: No abdominal pain.  No nausea, no vomiting.  Genitourinary: Negative for dysuria. Musculoskeletal: Positive for chronic and acute low back pain. Skin: Negative for rash. Neurological: Negative for headaches, focal weakness or numbness. ____________________________________________   PHYSICAL EXAM:  VITAL SIGNS: ED Triage Vitals  Enc Vitals Group     BP 04/05/18 1253 (!) 163/101     Pulse Rate 04/05/18 1253 84     Resp --      Temp 04/05/18 1253 98.2 F (36.8 C)     Temp Source 04/05/18 1253 Oral     SpO2 04/05/18 1253 97 %     Weight 04/05/18 1256 (!) 360 lb (163.3 kg)     Height 04/05/18 1256 5\' 11"  (1.803 m)     Head Circumference --      Peak Flow --  Pain Score 04/05/18 1255 9     Pain Loc --      Pain Edu? --      Excl. in GC? --    Constitutional: Alert and oriented. Well appearing and in no acute distress. Eyes: Conjunctivae are normal.  Head: Atraumatic. Neck: No stridor.   Cardiovascular: Normal rate, regular rhythm. Grossly normal heart sounds.  Good peripheral circulation. Respiratory: Normal respiratory effort.  No retractions. Lungs CTAB. Gastrointestinal: Soft and nontender. No distention.  Obese. Musculoskeletal: Examination of the back there is no gross deformity however the patient is diffusely tender to the right paravertebral muscles L5-S1 area.  There is active muscle spasms with range  of motion on the right.  No point tenderness is noted on palpation of the vertebral bodies.  Patient has good muscle strength bilaterally in the lower extremities.  Reflexes were 1+ bilaterally. Neurologic:  Normal speech and language. No gross focal neurologic deficits are appreciated.  Skin:  Skin is warm, dry and intact. No rash noted. Psychiatric: Mood and affect are normal. Speech and behavior are normal.  ____________________________________________   LABS (all labs ordered are listed, but only abnormal results are displayed)  Labs Reviewed - No data to display  PROCEDURES  Procedure(s) performed: None  Procedures  Critical Care performed: No  ____________________________________________   INITIAL IMPRESSION / ASSESSMENT AND PLAN / ED COURSE  As part of my medical decision making, I reviewed the following data within the electronic MEDICAL RECORD NUMBER Notes from prior ED visits and [place] Controlled Substance Database  Patient presents to the ED with complaint of acute exacerbation of his chronic back pain.  Patient has experienced chronic back pain for the last 10 years but is never followed up with an orthopedist.  He states that he was bending over last evening when he felt a pop.  He denies any paresthesias, saddle anesthesias or incontinence of bowel or bladder.  He has taken some over-the-counter medication without any relief.  Patient was given Norflex 60 mg IM and Norco while in the ED.  Muscle spasms were reduced and patient was more comfortable.  We discussed following up with his PCP or [name] who is on-call for orthopedics for his back problems.  Patient was discharged with prescription for prednisone tapering dose and methocarbamol 500 mg 1 or 2 every 6 hours as needed for muscle spasms.  ____________________________________________   FINAL CLINICAL IMPRESSION(S) / ED DIAGNOSES  Final diagnoses:  Acute exacerbation of chronic low back pain  Muscle spasm of back      ED Discharge Orders         Ordered    predniSONE (DELTASONE) 10 MG tablet     04/05/18 1533    methocarbamol (ROBAXIN) 500 MG tablet     04/05/18 1533           Note:  This document was prepared using Dragon voice recognition software and may include unintentional dictation errors.    Tommi RumpsSummers, Rhonda L, PA-C 04/05/18 1709    Sharman CheekStafford, Phillip, MD 04/07/18 2352

## 2018-04-05 NOTE — ED Triage Notes (Addendum)
Pt with chronic lower back pain x 10 years and was bending over last night and felt a pop. Pain now 9/10. Denies numbness or tingling in legs or changes in bowel or bladder.

## 2018-05-26 ENCOUNTER — Other Ambulatory Visit: Payer: Self-pay

## 2018-05-26 ENCOUNTER — Emergency Department: Payer: No Typology Code available for payment source

## 2018-05-26 ENCOUNTER — Encounter: Payer: Self-pay | Admitting: Emergency Medicine

## 2018-05-26 ENCOUNTER — Emergency Department
Admission: EM | Admit: 2018-05-26 | Discharge: 2018-05-26 | Disposition: A | Discharge status: Home / Self Care | Payer: No Typology Code available for payment source | Attending: Emergency Medicine | Admitting: Emergency Medicine

## 2018-05-26 DIAGNOSIS — Y9389 Activity, other specified: Secondary | ICD-10-CM | POA: Diagnosis not present

## 2018-05-26 DIAGNOSIS — Y999 Unspecified external cause status: Secondary | ICD-10-CM | POA: Diagnosis not present

## 2018-05-26 DIAGNOSIS — S3992XA Unspecified injury of lower back, initial encounter: Secondary | ICD-10-CM | POA: Diagnosis present

## 2018-05-26 DIAGNOSIS — S39012A Strain of muscle, fascia and tendon of lower back, initial encounter: Secondary | ICD-10-CM | POA: Diagnosis not present

## 2018-05-26 DIAGNOSIS — Y92410 Unspecified street and highway as the place of occurrence of the external cause: Secondary | ICD-10-CM | POA: Diagnosis not present

## 2018-05-26 LAB — URINALYSIS, COMPLETE (UACMP) WITH MICROSCOPIC
BACTERIA UA: NONE SEEN
Bilirubin Urine: NEGATIVE
Glucose, UA: NEGATIVE mg/dL
HGB URINE DIPSTICK: NEGATIVE
Ketones, ur: NEGATIVE mg/dL
Leukocytes,Ua: NEGATIVE
NITRITE: NEGATIVE
PROTEIN: 30 mg/dL — AB
SPECIFIC GRAVITY, URINE: 1.023 (ref 1.005–1.030)
pH: 6 (ref 5.0–8.0)

## 2018-05-26 MED ORDER — METHOCARBAMOL 500 MG PO TABS: 500.0000 mg | ORAL_TABLET | Freq: Four times a day (QID) | ORAL | 0 refills | Status: DC | PRN

## 2018-05-26 MED ORDER — IBUPROFEN 600 MG PO TABS: 600.0000 mg | ORAL_TABLET | Freq: Three times a day (TID) | ORAL | 0 refills | Status: DC | PRN
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
## 2018-05-26 NOTE — ED Notes (Signed)
EKG performed and reviewed by Dr. Roxan Hockeyobinson.
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
## 2019-06-07 ENCOUNTER — Emergency Department
Admission: EM | Admit: 2019-06-07 | Discharge: 2019-06-07 | Disposition: A | Discharge status: Home / Self Care | Payer: 59 | Attending: Emergency Medicine | Admitting: Emergency Medicine

## 2019-06-07 ENCOUNTER — Encounter: Payer: Self-pay | Admitting: Emergency Medicine

## 2019-06-07 ENCOUNTER — Other Ambulatory Visit: Payer: Self-pay

## 2019-06-07 DIAGNOSIS — L304 Erythema intertrigo: Secondary | ICD-10-CM | POA: Diagnosis not present

## 2019-06-07 DIAGNOSIS — R030 Elevated blood-pressure reading, without diagnosis of hypertension: Secondary | ICD-10-CM

## 2019-06-07 DIAGNOSIS — R21 Rash and other nonspecific skin eruption: Secondary | ICD-10-CM | POA: Diagnosis present

## 2019-06-07 MED ORDER — CLOTRIMAZOLE 1 % EX OINT: 1.0000 [IU] | TOPICAL_OINTMENT | Freq: Two times a day (BID) | CUTANEOUS | 0 refills | Status: DC

## 2019-06-07 NOTE — ED Triage Notes (Signed)
Presents with rash under his arm

## 2019-06-07 NOTE — ED Provider Notes (Signed)
[facility] Emergency Department Provider Note  ____________________________________________  Time seen: Approximately 5:07 PM  I have reviewed the triage vital signs and the nursing notes.   HISTORY  Chief Complaint Rash    HPI Keith Fox is a 44 y.o. male that presents to the emergency department for evaluation of rash to bilateral axilla for 3 months.  Patient states that he thought it was his deodorant but has switched his deodorant now several times without any improvement in rash.  He does get yeast infections regularly to his legs.  No fevers.  History reviewed. No pertinent past medical history.  Patient Active Problem List   Diagnosis Date Noted  . Allergic rhinitis 07/22/2017  . Class 3 severe obesity due to excess calories without serious comorbidity with body mass index (BMI) of 50.0 to 59.9 in adult [facility]) 07/22/2017    Past Surgical History:  Procedure Laterality Date  . OTHER SURGICAL HISTORY     had ulcer removed from small intestines    Prior to Admission medications   Medication Sig Start Date End Date Taking? Authorizing Provider  Clotrimazole 1 % OINT Apply 1 Units topically in the morning and at bedtime. 06/07/19   Laban Emperor, PA-C  ibuprofen (ADVIL,MOTRIN) 600 MG tablet Take 1 tablet (600 mg total) by mouth every 8 (eight) hours as needed. 05/26/18   Johnn Hai, PA-C  methocarbamol (ROBAXIN) 500 MG tablet Take 1 tablet (500 mg total) by mouth every 6 (six) hours as needed. 05/26/18   Johnn Hai, PA-C    Allergies Patient has no known allergies.  Family History  Problem Relation Age of Onset  . Parkinson's disease Father   . Breast cancer Neg Hx     Social History Social History   Tobacco Use  . Smoking status: Never Smoker  . Smokeless tobacco: Current User    Types: Chew  Substance Use Topics  . Alcohol use: Yes    Alcohol/week: 3.0 standard drinks    Types: 3 Cans of beer per week  . Drug  use: No     Review of Systems  Constitutional: No fever/chills Cardiovascular: No chest pain. Respiratory: No SOB. Gastrointestinal: No abdominal pain.  No nausea, no vomiting.  Musculoskeletal: Negative for musculoskeletal pain. Skin: Negative for abrasions, lacerations, ecchymosis.  Positive for rash. Neurological: Negative for numbness or tingling   ____________________________________________   PHYSICAL EXAM:  VITAL SIGNS: ED Triage Vitals [06/07/19 1654]  Enc Vitals Group     BP (!) 159/100     Pulse Rate 79     Resp 16     Temp 98.3 F (36.8 C)     Temp Source Oral     SpO2 96 %     Weight      Height      Head Circumference      Peak Flow      Pain Score      Pain Loc      Pain Edu?      Excl. in [place]?      Constitutional: Alert and oriented. Well appearing and in no acute distress. Eyes: Conjunctivae are normal. PERRL. EOMI. Head: Atraumatic. ENT:      Ears:      Nose: No congestion/rhinnorhea.      Mouth/Throat: Mucous membranes are moist.  Neck: No stridor.  Cardiovascular: Normal rate, regular rhythm.  Good peripheral circulation. Respiratory: Normal respiratory effort without tachypnea or retractions. Lungs CTAB. Good air entry to the  bases with no decreased or absent breath sounds. Musculoskeletal: Full range of motion to all extremities. No gross deformities appreciated. Neurologic:  Normal speech and language. No gross focal neurologic deficits are appreciated.  Skin:  Skin is warm, dry and intact.  Bright red well demarcated erythematous plaques to bilateral axilla. Psychiatric: Mood and affect are normal. Speech and behavior are normal. Patient exhibits appropriate insight and judgement.   ____________________________________________   LABS (all labs ordered are listed, but only abnormal results are displayed)  Labs Reviewed - No data to  display ____________________________________________  EKG   ____________________________________________  RADIOLOGY   No results found.  ____________________________________________    PROCEDURES  Procedure(s) performed:    Procedures    Medications - No data to display   ____________________________________________   INITIAL IMPRESSION / ASSESSMENT AND PLAN / ED COURSE  Pertinent labs & imaging results that were available during my care of the patient were reviewed by me and considered in my medical decision making (see chart for details).  Review of the [place] CSRS was performed in accordance of the [facility] prior to dispensing any controlled drugs.  Patient presents emergency department for evaluation of rash to bilateral axilla.  Vital signs and exam are reassuring.  Exam is consistent with candidiasis.  Less likely but possible that rash could be a contact dermatitis from deodorant.  Patient will be discharged home with prescriptions for clotrimazole. Patient is to follow up with dermatology as directed.  Referral was given to Dr. Adolphus Birchwood.  Patient is given ED precautions to return to the ED for any worsening or new symptoms.  Keith Fox was evaluated in Emergency Department on 06/07/2019 for the symptoms described in the history of present illness. He was evaluated in the context of the global COVID-19 pandemic, which necessitated consideration that the patient might be at risk for infection with the SARS-CoV-2 virus that causes COVID-19. Institutional protocols and algorithms that pertain to the evaluation of patients at risk for COVID-19 are in a state of rapid change based on information released by regulatory bodies including the CDC and federal and state organizations. These policies and algorithms were followed during the patient's care in the ED.   ____________________________________________  FINAL CLINICAL IMPRESSION(S) / ED DIAGNOSES  Final diagnoses:   Elevated blood pressure reading  Intertrigo      NEW MEDICATIONS STARTED DURING THIS VISIT:  ED Discharge Orders         Ordered    Clotrimazole 1 % OINT  2 times daily     06/07/19 1734              This chart was dictated using voice recognition software/Dragon. Despite best efforts to proofread, errors can occur which can change the meaning. Any change was purely unintentional.    Enid Derry, PA-C 06/07/19 2054    Minna Antis, MD 06/07/19 [phone]
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
## 2020-04-01 ENCOUNTER — Emergency Department
Admission: EM | Admit: 2020-04-01 | Discharge: 2020-04-01 | Disposition: A | Discharge status: Home / Self Care | Payer: 59 | Attending: Emergency Medicine | Admitting: Emergency Medicine

## 2020-04-01 ENCOUNTER — Other Ambulatory Visit: Payer: Self-pay

## 2020-04-01 ENCOUNTER — Encounter: Payer: Self-pay | Admitting: Emergency Medicine

## 2020-04-01 DIAGNOSIS — M545 Low back pain, unspecified: Secondary | ICD-10-CM | POA: Diagnosis not present

## 2020-04-01 DIAGNOSIS — G8929 Other chronic pain: Secondary | ICD-10-CM

## 2020-04-01 DIAGNOSIS — R202 Paresthesia of skin: Secondary | ICD-10-CM | POA: Insufficient documentation

## 2020-04-01 MED ORDER — PREDNISONE 10 MG (21) PO TBPK: ORAL_TABLET | ORAL | 0 refills | Status: DC

## 2020-04-01 MED ORDER — ORPHENADRINE CITRATE 30 MG/ML IJ SOLN
60.0000 mg | Freq: Two times a day (BID) | INTRAMUSCULAR | Status: DC
  Administered 2020-04-01: 60 mg via INTRAMUSCULAR
  Filled 2020-04-01: qty 2

## 2020-04-01 MED ORDER — BACLOFEN 10 MG PO TABS: 10.0000 mg | ORAL_TABLET | Freq: Three times a day (TID) | ORAL | 0 refills | Status: AC

## 2020-04-01 MED ORDER — HYDROCODONE-ACETAMINOPHEN 5-325 MG PO TABS
1.0000 | ORAL_TABLET | Freq: Once | ORAL | Status: AC
  Administered 2020-04-01: 1 via ORAL
  Filled 2020-04-01: qty 1

## 2020-04-01 MED ORDER — KETOROLAC TROMETHAMINE 60 MG/2ML IM SOLN
60.0000 mg | Freq: Once | INTRAMUSCULAR | Status: AC
  Administered 2020-04-01: 60 mg via INTRAMUSCULAR
  Filled 2020-04-01: qty 2

## 2020-04-01 NOTE — Discharge Instructions (Addendum)
Apply wet heat and ice to your lower back.  Start with heat and and with ice.  Take the Sterapred and baclofen as prescribed.  You may also take over-the-counter Tylenol.

## 2020-04-01 NOTE — ED Triage Notes (Signed)
Pt reports has arthritis in his back and has chronic back pain from it and needs something for the pain

## 2020-04-01 NOTE — ED Provider Notes (Signed)
[facility] Emergency Department Provider Note  ____________________________________________   Event Date/Time   First MD Initiated Contact with Patient 04/01/20 1204     (approximate)  I have reviewed the triage vital signs and the nursing notes.   HISTORY  Chief Complaint Back Pain    HPI Sigifredo Pignato is a 44 y.o. male presents to the emergency department with a flare of chronic back pain.  Patient states this happens about once a year.  States tingling in his legs is positional and will radiate to the knees.  Pain is located across the lower back.  No known injury.  States he just bent over to pick something up is normal and his back started acting up.  Conservative measures at home were not helping.    History reviewed. No pertinent past medical history.  Patient Active Problem List   Diagnosis Date Noted  . Allergic rhinitis 07/22/2017  . Class 3 severe obesity due to excess calories without serious comorbidity with body mass index (BMI) of 50.0 to 59.9 in adult [facility]) 07/22/2017    Past Surgical History:  Procedure Laterality Date  . OTHER SURGICAL HISTORY     had ulcer removed from small intestines    Prior to Admission medications   Medication Sig Start Date End Date Taking? Authorizing Provider  baclofen (LIORESAL) 10 MG tablet Take 1 tablet (10 mg total) by mouth 3 (three) times daily for 10 days. 04/01/20 04/11/20  [employer], Roselyn Bering, PA-C  predniSONE (STERAPRED UNI-PAK 21 TAB) 10 MG (21) TBPK tablet Take 6 pills on day one then decrease by 1 pill each day 04/01/20   Faythe Ghee, PA-C    Allergies Patient has no known allergies.  Family History  Problem Relation Age of Onset  . Parkinson's disease Father   . Breast cancer Neg Hx     Social History Social History   Tobacco Use  . Smoking status: Never Smoker  . Smokeless tobacco: Current User    Types: Chew  Vaping Use  . Vaping Use: Never used  Substance Use  Topics  . Alcohol use: Yes    Alcohol/week: 3.0 standard drinks    Types: 3 Cans of beer per week  . Drug use: No    Review of Systems  Constitutional: No fever/chills Eyes: No visual changes. ENT: No sore throat. Respiratory: Denies cough Cardiovascular: Denies chest pain Gastrointestinal: Denies abdominal pain Genitourinary: Negative for dysuria. Musculoskeletal: Positive for back pain. Skin: Negative for rash. Psychiatric: no mood changes,     ____________________________________________   PHYSICAL EXAM:  VITAL SIGNS: ED Triage Vitals  Enc Vitals Group     BP 04/01/20 1135 (!) 142/81     Pulse Rate 04/01/20 1135 68     Resp 04/01/20 1135 16     Temp 04/01/20 1135 98 F (36.7 C)     Temp Source 04/01/20 1135 Oral     SpO2 04/01/20 1135 98 %     Weight 04/01/20 1133 (!) 360 lb (163.3 kg)     Height 04/01/20 1133 5\' 11"  (1.803 m)     Head Circumference --      Peak Flow --      Pain Score 04/01/20 1132 10     Pain Loc --      Pain Edu? --      Excl. in GC? --     Constitutional: Alert and oriented. Well appearing and in no acute distress. Eyes: Conjunctivae are normal.  Head: Atraumatic. Nose: No congestion/rhinnorhea. Mouth/Throat: Mucous membranes are moist.   Neck:  supple no lymphadenopathy noted Cardiovascular: Normal rate, regular rhythm. Heart sounds are normal Respiratory: Normal respiratory effort.  No retractions, lungs c t a  GU: deferred Musculoskeletal: FROM all extremities, warm and well perfused, lumbar spine and paravertebral muscles are tender to palpation, SI joints are tender to palpation, 5/5 strength in lower extremities, neurovascular is intact Neurologic:  Normal speech and language.  Skin:  Skin is warm, dry and intact. No rash noted. Psychiatric: Mood and affect are normal. Speech and behavior are normal.  ____________________________________________   LABS (all labs ordered are listed, but only abnormal results are  displayed)  Labs Reviewed - No data to display ____________________________________________   ____________________________________________  RADIOLOGY    ____________________________________________   PROCEDURES  Procedure(s) performed: No  Procedures    ____________________________________________   INITIAL IMPRESSION / ASSESSMENT AND PLAN / ED COURSE  Pertinent labs & imaging results that were available during my care of the patient were reviewed by me and considered in my medical decision making (see chart for details).   Patient is a 44 year old male presents with back pain.  See HPI.  Physical exam shows a lower back to be tender to palpation.  Patient has had x-rays within the past year that showed no abnormality.  I feel this is more just a muscle strain at this time.  Patient was given Norflex 60 mg IM, Toradol 60 mg IM, and Vicodin 1 p.o.  He will be discharged with steroid pack and muscle relaxer.  He is to follow-up with his regular doctor or orthopedics if not improving in 3 to 5 days.  Return if worsening.     Jusiah Aguayo Laine was evaluated in Emergency Department on 04/01/2020 for the symptoms described in the history of present illness. He was evaluated in the context of the global COVID-19 pandemic, which necessitated consideration that the patient might be at risk for infection with the SARS-CoV-2 virus that causes COVID-19. Institutional protocols and algorithms that pertain to the evaluation of patients at risk for COVID-19 are in a state of rapid change based on information released by regulatory bodies including the CDC and federal and state organizations. These policies and algorithms were followed during the patient's care in the ED.    As part of my medical decision making, I reviewed the following data within the electronic MEDICAL RECORD NUMBER Nursing notes reviewed and incorporated, Old chart reviewed, Notes from prior ED visits and [place] Controlled  Substance Database  ____________________________________________   FINAL CLINICAL IMPRESSION(S) / ED DIAGNOSES  Final diagnoses:  Acute exacerbation of chronic low back pain      NEW MEDICATIONS STARTED DURING THIS VISIT:  New Prescriptions   BACLOFEN (LIORESAL) 10 MG TABLET    Take 1 tablet (10 mg total) by mouth 3 (three) times daily for 10 days.   PREDNISONE (STERAPRED UNI-PAK 21 TAB) 10 MG (21) TBPK TABLET    Take 6 pills on day one then decrease by 1 pill each day     Note:  This document was prepared using Dragon voice recognition software and may include unintentional dictation errors.    Faythe Ghee, PA-C 04/01/20 1313    Delton Prairie, MD 04/01/20 1540

## 2020-10-02 ENCOUNTER — Ambulatory Visit: Payer: BC Managed Care – PPO | Admitting: Family Medicine

## 2020-10-02 ENCOUNTER — Encounter: Payer: Self-pay | Admitting: Family Medicine

## 2020-10-02 ENCOUNTER — Other Ambulatory Visit: Payer: Self-pay

## 2020-10-02 VITALS — BP 137/92 | HR 67 | Temp 98.0°F | Wt 353.0 lb

## 2020-10-02 DIAGNOSIS — L304 Erythema intertrigo: Secondary | ICD-10-CM

## 2020-10-02 DIAGNOSIS — R220 Localized swelling, mass and lump, head: Secondary | ICD-10-CM

## 2020-10-02 NOTE — Progress Notes (Signed)
Acute Office Visit  Subjective:    Patient ID: Keith Fox, male    DOB: [birth year], 45 y.o.   MRN: [id number]  No chief complaint on file.   HPI Patient is in today for evaluation of a knot on his left jaw.  He states he noticed it about 2 weeks ago.  It has since started getting smaller.  He denies there being any pain, redness, drainage or warmth to it.  He repots no known insect bites, cold symptoms, difficulty swallowing or having dry mouth.   He also complains of dry skin and rash under his arms.    No past medical history on file.  Past Surgical History:  Procedure Laterality Date   OTHER SURGICAL HISTORY     had ulcer removed from small intestines   Family History  Problem Relation Age of Onset   Parkinson's disease Father    Breast cancer Neg Hx    Social History   Socioeconomic History   Marital status: Married    Spouse name: Not on file   Number of children: Not on file   Years of education: Not on file   Highest education level: Not on file  Occupational History   Not on file  Tobacco Use   Smoking status: Never   Smokeless tobacco: Current    Types: Chew  Vaping Use   Vaping Use: Never used  Substance and Sexual Activity   Alcohol use: Yes    Alcohol/week: 3.0 standard drinks    Types: 3 Cans of beer per week   Drug use: No   Sexual activity: Not on file  Other Topics Concern   Not on file  Social History Narrative   Not on file   Social Determinants of Health   Financial Resource Strain: Not on file  Food Insecurity: Not on file  Transportation Needs: Not on file  Physical Activity: Not on file  Stress: Not on file  Social Connections: Not on file  Intimate Partner Violence: Not on file    Outpatient Medications Prior to Visit  Medication Sig Dispense Refill   predniSONE (STERAPRED UNI-PAK 21 TAB) 10 MG (21) TBPK tablet Take 6 pills on day one then decrease by 1 pill each day 21 tablet 0   No facility-administered medications  prior to visit.    No Known Allergies  Review of Systems  HENT: Negative.    Respiratory: Negative.    Cardiovascular: Negative.   Gastrointestinal: Negative.   Genitourinary: Negative.       Objective:    Physical Exam  BP (!) 137/92 (BP Location: Right Arm, Patient Position: Sitting, Cuff Size: Large)   Pulse 67   Temp 98 F (36.7 C) (Oral)   Wt (!) 353 lb (160.1 kg)   SpO2 99%   BMI 49.23 kg/m  Wt Readings from Last 3 Encounters:  04/01/20 (!) 360 lb (163.3 kg)  05/26/18 (!) 350 lb (158.8 kg)  04/05/18 (!) 360 lb (163.3 kg)   BP Readings from Last 3 Encounters:  10/02/20 (!) 137/92  04/01/20 (!) 142/81  06/07/19 (!) 159/100   Health Maintenance Due  Topic Date Due   COVID-19 Vaccine (1) Never done   HIV Screening  Never done   Hepatitis C Screening  Never done   TETANUS/TDAP  Never done    There are no preventive care reminders to display for this patient.   Lab Results  Component Value Date   TSH 2.680 07/22/2017   Lab Results  Component Value Date   WBC 8.9 10/26/2017   HGB 15.7 10/26/2017   HCT 44.3 10/26/2017   MCV 87.7 10/26/2017   PLT 226 10/26/2017   Lab Results  Component Value Date   NA 139 10/26/2017   K 3.3 (L) 10/26/2017   CO2 23 10/26/2017   GLUCOSE 231 (H) 10/26/2017   BUN 20 10/26/2017   CREATININE 0.91 10/26/2017   BILITOT 0.4 07/22/2017   ALKPHOS 68 07/22/2017   AST 39 07/22/2017   ALT 56 (H) 07/22/2017   PROT 7.2 07/22/2017   ALBUMIN 4.5 07/22/2017   CALCIUM 9.3 10/26/2017   ANIONGAP 9 10/26/2017   Lab Results  Component Value Date   CHOL 171 07/22/2017   Lab Results  Component Value Date   HDL 38 (L) 07/22/2017   Lab Results  Component Value Date   LDLCALC 95 07/22/2017   Lab Results  Component Value Date   TRIG 188 (H) 07/22/2017   Lab Results  Component Value Date   CHOLHDL 4.5 07/22/2017   Lab Results  Component Value Date   HGBA1C 6.5 (H) 07/22/2017       Assessment & Plan:   1. Jaw  swelling  Some swelling of the left jaw angle without pain or TMJ crepitus over the past 2 weeks. No dry mouth or earache. Suspect obstruction of salivary gland duct. Swelling has diminished and no palpable stone. Some dipping of snuff. No internal buccal lesions or masses. Left lower wisdom tooth is angled up with a caries. Recommend evaluation by dentist.  2. Intertrigo Intermittent rash and itching in axillae and skin folds at the belt line. May use Lotrimin cream with equal amount of Hydrocortisone cream applied BID and Zeasorb powder to keep area dry. Recheck prn.    No orders of the defined types were placed in this encounter.  Adline Peals, CMA  I, Dortha Kern, PA-C, have reviewed all documentation for this visit. The documentation on 10/02/20 for the exam, diagnosis, procedures, and orders are all accurate and complete.

## 2021-02-03 ENCOUNTER — Emergency Department
Admission: EM | Admit: 2021-02-03 | Discharge: 2021-02-03 | Disposition: A | Discharge status: Home / Self Care | Payer: BC Managed Care – PPO | Attending: Student | Admitting: Student

## 2021-02-03 ENCOUNTER — Other Ambulatory Visit: Payer: Self-pay

## 2021-02-03 ENCOUNTER — Emergency Department: Payer: BC Managed Care – PPO

## 2021-02-03 DIAGNOSIS — R059 Cough, unspecified: Secondary | ICD-10-CM | POA: Diagnosis not present

## 2021-02-03 DIAGNOSIS — F17220 Nicotine dependence, chewing tobacco, uncomplicated: Secondary | ICD-10-CM | POA: Diagnosis not present

## 2021-02-03 DIAGNOSIS — J40 Bronchitis, not specified as acute or chronic: Secondary | ICD-10-CM | POA: Diagnosis not present

## 2021-02-03 DIAGNOSIS — R051 Acute cough: Secondary | ICD-10-CM

## 2021-02-03 MED ORDER — AZITHROMYCIN 250 MG PO TABS: ORAL_TABLET | ORAL | 0 refills | Status: AC

## 2021-02-03 MED ORDER — BENZONATATE 100 MG PO CAPS: 100.0000 mg | ORAL_CAPSULE | Freq: Three times a day (TID) | ORAL | 0 refills | Status: AC | PRN

## 2021-02-03 MED ORDER — IPRATROPIUM-ALBUTEROL 0.5-2.5 (3) MG/3ML IN SOLN
3.0000 mL | Freq: Once | RESPIRATORY_TRACT | Status: AC
  Administered 2021-02-03: 3 mL via RESPIRATORY_TRACT
  Filled 2021-02-03: qty 3

## 2021-02-03 NOTE — ED Provider Notes (Signed)
[facility] Emergency Department Provider Note   ____________________________________________   Event Date/Time   First MD Initiated Contact with Patient 02/03/21 1119     (approximate)  I have reviewed the triage vital signs and the nursing notes.   HISTORY  Chief Complaint Cough and Chest Pain    HPI Yama Nielson is a 45 y.o. male patient presents to the emergency room with complaint of 3 weeks of nonproductive cough.  Denies any known exposure to COVID.  Denies runny nose, sore throat, abdominal pain, back pain, fever, chest pain.  Patient denies shortness of breath or dyspnea on exertion.  Patient does endorse chest tightness with cough.  Patient reports that the chest tightness would be rated as a 4 out of 10 but only with cough.  Patient reports that when taking a deep breath it does induce coughing.  This is evidenced on exam as well.  No past medical history on file.  Patient Active Problem List   Diagnosis Date Noted   Allergic rhinitis 07/22/2017   Class 3 severe obesity due to excess calories without serious comorbidity with body mass index (BMI) of 50.0 to 59.9 in adult [facility]) 07/22/2017    Past Surgical History:  Procedure Laterality Date   OTHER SURGICAL HISTORY     had ulcer removed from small intestines    Prior to Admission medications   Medication Sig Start Date End Date Taking? Authorizing Provider  azithromycin (ZITHROMAX Z-PAK) 250 MG tablet Take 2 tablets (500 mg) on  Day 1,  followed by 1 tablet (250 mg) once daily on Days 2 through 5. 02/03/21 02/08/21 Yes Himani Corona, Pearlie Oyster, NP  benzonatate (TESSALON PERLES) 100 MG capsule Take 1 capsule (100 mg total) by mouth 3 (three) times daily as needed for up to 5 days for cough. 02/03/21 02/08/21 Yes Herschell Dimes, NP  predniSONE (STERAPRED UNI-PAK 21 TAB) 10 MG (21) TBPK tablet Take 6 pills on day one then decrease by 1 pill each day Patient not taking: Reported on 10/02/2020  04/01/20   Faythe Ghee, PA-C    Allergies Patient has no known allergies.  Family History  Problem Relation Age of Onset   Parkinson's disease Father    Breast cancer Neg Hx     Social History Social History   Tobacco Use   Smoking status: Never   Smokeless tobacco: Current    Types: Chew  Vaping Use   Vaping Use: Never used  Substance Use Topics   Alcohol use: Yes    Alcohol/week: 3.0 standard drinks    Types: 3 Cans of beer per week   Drug use: No    Review of Systems  Constitutional: No fever/chills Eyes: No visual changes. ENT: No sore throat.  No ear pain.  No rhinorrhea or nasal congestion. Cardiovascular: Denies chest pain.  Reports chest pain with cough only. Respiratory: Denies shortness of breath.  Denies dyspnea on exertion. Gastrointestinal: No abdominal pain.  No nausea, no vomiting.  No diarrhea.  No constipation. Genitourinary: Negative for dysuria. Musculoskeletal: Negative for back pain. Skin: Negative for rash. Neurological: Negative for headaches, focal weakness or numbness.   ____________________________________________   PHYSICAL EXAM:  VITAL SIGNS: ED Triage Vitals  Enc Vitals Group     BP 02/03/21 1013 (!) 155/97     Pulse Rate 02/03/21 1013 71     Resp 02/03/21 1013 18     Temp 02/03/21 1013 97.6 F (36.4 C)     Temp  Source 02/03/21 1013 Oral     SpO2 02/03/21 1013 95 %     Weight 02/03/21 1014 (!) 350 lb (158.8 kg)     Height 02/03/21 1014 5\' 11"  (1.803 m)     Head Circumference --      Peak Flow --      Pain Score 02/03/21 1018 0     Pain Loc --      Pain Edu? --      Excl. in [place]? --     Constitutional: Alert and oriented. Well appearing and in no acute distress. Eyes: Conjunctivae are normal. PERRL. EOMI. Head: Atraumatic. Nose: No congestion/rhinnorhea. Mouth/Throat: Mucous membranes are moist.  Oropharynx non-erythematous. Neck: No stridor.  Hematological/Lymphatic/Immunilogical: No cervical  lymphadenopathy. Cardiovascular: Normal rate, regular rhythm. Grossly normal heart sounds.  Good peripheral circulation.  Patient does not have chest pain but does endorse chest tightness with cough only.  Patient reports that he can hear "gurgling "with respirations.  This is not heard on exam.  Patient is slightly hypertensive. Respiratory: Normal respiratory effort.  No retractions. Lungs with wheezing in bilateral lower lobes. Gastrointestinal: Soft and nontender. No distention. No abdominal bruits. No CVA tenderness. Musculoskeletal: No lower extremity tenderness nor edema.  No joint effusions.  Has body aches. Neurologic:  Normal speech and language. No gross focal neurologic deficits are appreciated. No gait instability. Skin:  Skin is warm, dry and intact. No rash noted. Psychiatric: Mood and affect are normal. Speech and behavior are normal.  ____________________________________________   LABS (all labs ordered are listed, but only abnormal results are displayed)  Labs Reviewed - No data to display ____________________________________________  EKG EKG was reviewed by emergency room physician and found to be normal at this time.  ____________________________________________  RADIOLOGY  ED MD interpretation: Chest x-ray is reviewed and agree with findings of radiologist.  Official radiology report(s): DG Chest 2 View  Result Date: 02/03/2021 CLINICAL DATA:  Productive cough with pain in a 45 year old male. EXAM: CHEST - 2 VIEW COMPARISON:  Comparison made with May 26, 2018. FINDINGS: The heart size and mediastinal contours are within normal limits. Both lungs are clear. The visualized skeletal structures are unremarkable. IMPRESSION: No active cardiopulmonary disease. Electronically Signed   By: May 28, 2018 M.D.   On: 02/03/2021 10:47    ____________________________________________   PROCEDURES  Procedure(s) performed: None  Procedures  Critical Care performed:  No  ____________________________________________   INITIAL IMPRESSION / ASSESSMENT AND PLAN / ED COURSE     Patient presents to the emergency room with 3 weeks of nonproductive cough.  Patient reports chest tightness with deep cough.  Patient denies chest pain, shortness of breath, dyspnea on exertion.  See HPI for further history. Patient had chest x-ray ordered and it is negative for active disease.  Patient is noted to have bilateral lower lobe wheezing on exam.  Will administer DuoNeb and reassess lungs at that time. After DuoNeb patient's lungs are clear auscultation in all lung fields.  Patient has decreased cough with inspiration.  Patient reports that chest tightness/cough have improved. Due to 3 weeks worth of cough and patient's presentation we will treat with Z-Pak and Tessalon Perles. Return precautions are discussed with patient.      ____________________________________________   FINAL CLINICAL IMPRESSION(S) / ED DIAGNOSES  Final diagnoses:  Acute cough  Bronchitis     ED Discharge Orders          Ordered    azithromycin (ZITHROMAX Z-PAK) 250 MG tablet  02/03/21 1220    benzonatate (TESSALON PERLES) 100 MG capsule  3 times daily PRN        02/03/21 1220             Note:  This document was prepared using Dragon voice recognition software and may include unintentional dictation errors.     Herschell Dimes, NP 02/03/21 1225    Merwyn Katos, MD 02/03/21 [phone]

## 2021-02-03 NOTE — Discharge Instructions (Signed)
Patient will be discharged with Z-Pak and Tessalon Perles.  He was instructed on directions for usage. Patient was strict return instructions if he were to develop chest pain, shortness of breath, dizziness, or shortness of breath with exertion. He is to report to his PCP if symptoms persist after taking antibiotic.

## 2021-02-03 NOTE — ED Triage Notes (Signed)
Pt reports cough for the past 3 weeks that it now productive and hurts in his chest when he coughs.

## 2021-05-07 ENCOUNTER — Emergency Department
Admission: EM | Admit: 2021-05-07 | Discharge: 2021-05-07 | Disposition: A | Discharge status: Home / Self Care | Payer: BC Managed Care – PPO | Attending: Emergency Medicine | Admitting: Emergency Medicine

## 2021-05-07 ENCOUNTER — Encounter: Payer: Self-pay | Admitting: Emergency Medicine

## 2021-05-07 ENCOUNTER — Other Ambulatory Visit: Payer: Self-pay

## 2021-05-07 DIAGNOSIS — K047 Periapical abscess without sinus: Secondary | ICD-10-CM | POA: Insufficient documentation

## 2021-05-07 DIAGNOSIS — K0889 Other specified disorders of teeth and supporting structures: Secondary | ICD-10-CM | POA: Diagnosis not present

## 2021-05-07 MED ORDER — AMOXICILLIN 875 MG PO TABS: 875.0000 mg | ORAL_TABLET | Freq: Two times a day (BID) | ORAL | 0 refills | Status: DC

## 2021-05-07 MED ORDER — HYDROCODONE-ACETAMINOPHEN 5-325 MG PO TABS
1.0000 | ORAL_TABLET | Freq: Once | ORAL | Status: AC
  Administered 2021-05-07: 14:00:00 1 via ORAL
  Filled 2021-05-07: qty 1

## 2021-05-07 MED ORDER — IBUPROFEN 600 MG PO TABS: 600.0000 mg | ORAL_TABLET | Freq: Four times a day (QID) | ORAL | 0 refills | Status: DC | PRN

## 2021-05-07 NOTE — ED Triage Notes (Signed)
Presents with dental pain  states pain is on left side  states pain started slightly about 1 week ago  became worse 2 days ago

## 2021-05-07 NOTE — Discharge Instructions (Signed)
Follow-up with one of the dentist listed on your discharge papers.  Your blood pressure today in the emergency department was elevated at 170/119 however this could be due to pain.  Have your blood pressure checked at the end of the week and if it continues to be elevated you will need to have this treated especially if there is someone in your family that has hypertension.  A list of dental clinics is on your discharge papers.  Also the dental clinic at [facility] takes walk-ins.  Called to see what hours they take walk-in patients.  A prescription was sent to your pharmacy for infection and inflammation.  OPTIONS FOR DENTAL FOLLOW UP CARE  [place] Department of Health and Human Services - Local Safety Net Dental Clinics [email or web address]   [facility] [phone])  [facility] [phone])  [facility] [phone])  [facility] [phone])  [facility] [phone]) This clinic caters to the indigent population and is on a lottery system. Location: [employer] of Dentistry, [employer], [address], [place] Clinic Hours: Wednesdays from 6pm - 9pm, patients seen by a lottery system. For dates, call or go to [email or web address] Services: Cleanings, fillings and simple extractions. Payment Options: DENTAL WORK IS FREE OF CHARGE. Bring proof of income or support. Best way to get seen: Arrive at 5:15 pm - this is a lottery, NOT first come/first serve, so arriving earlier will not increase your chances of being seen.     [facility] [phone] Select option 1 for emergencies   Location: [facility] of [facility], [place], [address] [place] Clinic Hours: No walk-ins accepted - call the day before to schedule an appointment. Check in times are 9:30 am and 1:30 pm. Services: Simple  extractions, temporary fillings, pulpectomy/pulp debridement, uncomplicated abscess drainage. Payment Options: PAYMENT IS DUE AT THE TIME OF SERVICE.  Fee is usually $100-200, additional surgical procedures (e.g. abscess drainage) may be extra. Cash, checks, Visa/MasterCard accepted.  Can file Medicaid if patient is covered for dental - patient should call case worker to check. No discount for [facility] patients. Best way to get seen: MUST call the day before and get onto the schedule. Can usually be seen the next 1-2 days. No walk-ins accepted.     [facility] [phone]   Location: [facility], [address], [place] Clinic Hours: M, W, Th, F 8am or 1:30pm, Tues 9a or 1:30 - first come/first served. Services: Simple extractions, temporary fillings, uncomplicated abscess drainage.  You do not need to be an [facility] resident. Payment Options: PAYMENT IS DUE AT THE TIME OF SERVICE. Dental insurance, otherwise sliding scale - bring proof of income or support. Depending on income and treatment needed, cost is usually $50-200. Best way to get seen: Arrive early as it is first come/first served.     [facility] [phone]   Location: [address] Clinic Hours: Mon-Thu 8a-5p Services: Most basic dental services including extractions and fillings. Payment Options: PAYMENT IS DUE AT THE TIME OF SERVICE. Sliding scale, up to 50% off - bring proof if income or support. Medicaid with dental option accepted. Best way to get seen: Call to schedule an appointment, can usually be seen within 2 weeks OR they will try to see walk-ins - show up at 8a or 2p (you may have to wait).     [facility]  [phone] [place] RESIDENTS ONLY   Location: [employer], [address] [place], [place] [address] Clinic Hours: By appointment only. Monday -  Thursday 8am-5pm, Friday 8am-12pm Services: Cleanings, fillings, extractions. Payment Options: PAYMENT IS DUE AT THE TIME OF SERVICE. Cash, Visa or MasterCard. Sliding scale - $30 minimum per service. Best way to get seen: Come in to office, complete packet and make an appointment - need proof of income or support monies for each household member and proof of [facility] residence. Usually takes about a month to get in.     [facility] [phone]   Location: [address], [facility] Clinic Hours: Walk-in Urgent Care Dental Services are offered Monday-Friday mornings only. The numbers of emergencies accepted daily is limited to the number of providers available. Maximum 15 - Mondays, Wednesdays & Thursdays Maximum 10 - Tuesdays & Fridays Services: You do not need to be a [facility] resident to be seen for a dental emergency. Emergencies are defined as pain, swelling, abnormal bleeding, or dental trauma. Walkins will receive x-rays if needed. NOTE: Dental cleaning is not an emergency. Payment Options: PAYMENT IS DUE AT THE TIME OF SERVICE. Minimum co-pay is $40.00 for uninsured patients. Minimum co-pay is $3.00 for Medicaid with dental coverage. Dental Insurance is accepted and must be presented at time of visit. Medicare does not cover dental. Forms of payment: Cash, credit card, checks. Best way to get seen: If not previously registered with the clinic, walk-in dental registration begins at 7:15 am and is on a first come/first serve basis. If previously registered with the clinic, call to make an appointment.     [facility] [phone] [place] RESIDENTS ONLY   Location: [address] [place], [place] Clinic Hours: Mon-Thu 10a-2p Services: Extractions only! Payment Options: FREE (donations accepted) - bring proof of income or support Best way to get seen: Call and schedule an appointment OR come at 8am on the  1st Monday of every month (except for holidays) when it is first come/first served.     [facility] [phone]   Location: [address] [place], [place] Clinic Hours: Friday mornings Services, Payment Options, Best way to get seen: Call for info

## 2021-05-07 NOTE — ED Provider Notes (Signed)
[facility] Provider Note    Event Date/Time   First MD Initiated Contact with Patient 05/07/21 1241     (approximate)   History   Dental Pain   HPI  Keith Fox is a 46 y.o. male presents with left lower dental pain for several days.  Patient denies any recent injury.  He denies any fever or chills.  He has not taken any over-the-counter medications that have worked for him thus far.  He has a history of allergic rhinitis and currently chews tobacco.     Physical Exam   Triage Vital Signs: ED Triage Vitals [05/07/21 1238]  Enc Vitals Group     BP (!) 170/119     Pulse Rate 71     Resp 17     Temp 98.6 F (37 C)     Temp Source Oral     SpO2 98 %     Weight (!) 350 lb (158.8 kg)     Height 6' (1.829 m)     Head Circumference      Peak Flow      Pain Score 9     Pain Loc      Pain Edu?      Excl. in [place]?     Most recent vital signs: Vitals:   05/07/21 1238 05/07/21 1421  BP: (!) 170/119 (!) 168/100  Pulse: 71 70  Resp: 17 16  Temp: 98.6 F (37 C)   SpO2: 98% 99%     General: Awake, no distress.  Appears to be uncomfortable. CV:  Good peripheral perfusion.  Resp:  Normal effort.  Abd:  No distention.  Other:  Examination of the mouth the lower posterior left molar with cavity present and gum edema.  No active drainage is noted.  Patient has placed some over-the-counter medication on the tooth to decrease pain.  No cervical lymphadenopathy noted.   ED Results / Procedures / Treatments   Labs (all labs ordered are listed, but only abnormal results are displayed) Labs Reviewed - No data to display   PROCEDURES:  Critical Care performed: No  Procedures   MEDICATIONS ORDERED IN ED: Medications  HYDROcodone-acetaminophen (NORCO/VICODIN) 5-325 MG per tablet 1 tablet (1 tablet Oral Given 05/07/21 1420)     IMPRESSION / MDM / ASSESSMENT AND PLAN / ED COURSE  I reviewed the triage vital signs and the nursing  notes.  46 year old male presents to the ED with complaint of left lower dental pain for several days.  Patient does not have a dentist currently.  Patient was given Norco 1 tablet while in the ED.  A prescription for amoxicillin 875 twice daily and ibuprofen 600 mg every 6 hours as needed for pain and inflammation was sent to his pharmacy.  Patient was given list of dental clinics in the area so that he can make an appointment for further treatment.   FINAL CLINICAL IMPRESSION(S) / ED DIAGNOSES   Final diagnoses:  Dental abscess     Rx / DC Orders   ED Discharge Orders          Ordered    amoxicillin (AMOXIL) 875 MG tablet  2 times daily        05/07/21 1400    ibuprofen (ADVIL) 600 MG tablet  Every 6 hours PRN        05/07/21 1400             Note:  This document was prepared using Dragon voice  recognition software and may include unintentional dictation errors.   Johnn Hai, PA-C 05/07/21 1520    Vanessa Sterrett, MD 05/08/21 [phone]

## 2021-10-16 ENCOUNTER — Telehealth: Payer: Self-pay | Admitting: Physician Assistant

## 2021-10-16 NOTE — Telephone Encounter (Signed)
Pt wife stated pt has a hernia in his chest. He has little episodes every now and then and hasn't had one in a while, but pt is losing weight and can be seen more.  Pt wife also mentioned pt has a lot of skin dryness in his buttocks.   At this time, his wife stated pt has reported no symptoms. Just wants Lillia Abed to look at it when he comes in for the upcoming appointment on October 28, 2021.  Please advise.

## 2021-10-16 NOTE — Telephone Encounter (Signed)
Okay, aware for next appt

## 2021-10-28 ENCOUNTER — Ambulatory Visit (INDEPENDENT_AMBULATORY_CARE_PROVIDER_SITE_OTHER): Payer: BC Managed Care – PPO | Admitting: Physician Assistant

## 2021-10-28 ENCOUNTER — Encounter: Payer: Self-pay | Admitting: Physician Assistant

## 2021-10-28 VITALS — BP 126/84 | Temp 97.7°F | Resp 16 | Ht 71.0 in | Wt 313.4 lb

## 2021-10-28 DIAGNOSIS — Z1211 Encounter for screening for malignant neoplasm of colon: Secondary | ICD-10-CM

## 2021-10-28 DIAGNOSIS — Z0000 Encounter for general adult medical examination without abnormal findings: Secondary | ICD-10-CM

## 2021-10-28 DIAGNOSIS — B351 Tinea unguium: Secondary | ICD-10-CM

## 2021-10-28 DIAGNOSIS — L304 Erythema intertrigo: Secondary | ICD-10-CM

## 2021-10-28 DIAGNOSIS — R1906 Epigastric swelling, mass or lump: Secondary | ICD-10-CM

## 2021-10-28 DIAGNOSIS — R7303 Prediabetes: Secondary | ICD-10-CM | POA: Diagnosis not present

## 2021-10-28 MED ORDER — CLOTRIMAZOLE-BETAMETHASONE 1-0.05 % EX CREA: 1.0000 | TOPICAL_CREAM | Freq: Every day | CUTANEOUS | 1 refills | Status: DC

## 2021-10-28 NOTE — Assessment & Plan Note (Signed)
Historically will recheck 

## 2021-10-28 NOTE — Progress Notes (Signed)
I,Keith Fox,acting as a Neurosurgeon for [employer], PA-C.,have documented all relevant documentation on the behalf of Keith Ferguson, PA-C,as directed by  Keith Ferguson, PA-C while in the presence of Keith Ferguson, PA-C.   Complete physical exam   Patient: Keith Fox   DOB: [birth year]   46 y.o. Male  MRN: [id number] Visit Date: 10/28/2021  Today's healthcare provider: [name], PA-C   Chief Complaint  Patient presents with   Annual Exam   Subjective    Render Keith Fox is a 46 y.o. male who presents today for a complete physical exam.  He reports consuming a low fat diet. Home exercise routine includes softball. He generally feels well. He reports sleeping well. He does have additional problems to discuss today.  HPI  Hernia upper abdomen -Reports noticing it in his 76s and was told it was a hernia. Sometimes becomes swollen and painful. No associated with movement, food. Reports it is always there. More an annoyance. Denies changes in BM,  Rash -Reports a pruritic painful rash between his thighs and under his arms. Uses a hydrating lotion but isnt helpful. Denies bleeding, bug bites.   Toenail appearance -For years have not grown right, have looked thick and hard.  History reviewed. No pertinent past medical history. Past Surgical History:  Procedure Laterality Date   OTHER SURGICAL HISTORY     had ulcer removed from small intestines   Social History   Socioeconomic History   Marital status: Married    Spouse name: Not on file   Number of children: Not on file   Years of education: Not on file   Highest education level: Not on file  Occupational History   Not on file  Tobacco Use   Smoking status: Never   Smokeless tobacco: Current    Types: Chew  Vaping Use   Vaping Use: Never used  Substance and Sexual Activity   Alcohol use: Yes    Alcohol/week: 3.0 standard drinks of alcohol    Types: 3 Cans of beer per week   Drug use: No    Sexual activity: Not on file  Other Topics Concern   Not on file  Social History Narrative   Not on file   Social Determinants of Health   Financial Resource Strain: Not on file  Food Insecurity: Not on file  Transportation Needs: Not on file  Physical Activity: Not on file  Stress: Not on file  Social Connections: Not on file  Intimate Partner Violence: Not on file   Family Status  Relation Name Status   Mother  Alive   Father  Deceased   Sister  Alive   Neg Hx  (Not Specified)   Family History  Problem Relation Age of Onset   Parkinson's disease Father    Breast cancer Neg Hx    No Known Allergies  Patient Care Team: Keith Ferguson, PA-C as PCP - General (Physician Assistant)   Medications: Outpatient Medications Prior to Visit  Medication Sig   phentermine 37.5 MG capsule Take 37.5 mg by mouth 2 (two) times daily.   [DISCONTINUED] amoxicillin (AMOXIL) 875 MG tablet Take 1 tablet (875 mg total) by mouth 2 (two) times daily.   [DISCONTINUED] ibuprofen (ADVIL) 600 MG tablet Take 1 tablet (600 mg total) by mouth every 6 (six) hours as needed.   No facility-administered medications prior to visit.    Review of Systems  Genitourinary:  Positive for frequency.  All other systems reviewed and are  negative.   Last CBC Lab Results  Component Value Date   WBC 8.9 10/26/2017   HGB 15.7 10/26/2017   HCT 44.3 10/26/2017   MCV 87.7 10/26/2017   MCH 31.2 10/26/2017   RDW 13.2 10/26/2017   PLT 226 10/26/2017   Last metabolic panel Lab Results  Component Value Date   GLUCOSE 231 (H) 10/26/2017   NA 139 10/26/2017   K 3.3 (L) 10/26/2017   CL 107 10/26/2017   CO2 23 10/26/2017   BUN 20 10/26/2017   CREATININE 0.91 10/26/2017   GFRNONAA >60 10/26/2017   CALCIUM 9.3 10/26/2017   PROT 7.2 07/22/2017   ALBUMIN 4.5 07/22/2017   LABGLOB 2.7 07/22/2017   AGRATIO 1.7 07/22/2017   BILITOT 0.4 07/22/2017   ALKPHOS 68 07/22/2017   AST 39 07/22/2017   ALT 56 (H)  07/22/2017   ANIONGAP 9 10/26/2017   Last lipids Lab Results  Component Value Date   CHOL 171 07/22/2017   HDL 38 (L) 07/22/2017   LDLCALC 95 07/22/2017   TRIG 188 (H) 07/22/2017   CHOLHDL 4.5 07/22/2017   Last hemoglobin A1c Lab Results  Component Value Date   HGBA1C 6.5 (H) 07/22/2017   Last thyroid functions Lab Results  Component Value Date   TSH 2.680 07/22/2017      Objective     BP 126/84 (BP Location: Left Arm, Patient Position: Sitting, Cuff Size: Large)   Temp 97.7 F (36.5 C) (Oral)   Resp 16   Ht 5\' 11"  (1.803 m)   Wt (!) 313 lb 6.4 oz (142.2 kg)   BMI 43.71 kg/m  BP Readings from Last 3 Encounters:  10/28/21 126/84  05/07/21 (!) 168/100  02/03/21 (!) 161/89   Wt Readings from Last 3 Encounters:  10/28/21 (!) 313 lb 6.4 oz (142.2 kg)  05/07/21 (!) 350 lb (158.8 kg)  02/03/21 (!) 350 lb (158.8 kg)    Physical Exam Constitutional:      General: He is awake.     Appearance: He is well-developed.  HENT:     Head: Normocephalic.     Right Ear: Tympanic membrane, ear canal and external ear normal.     Left Ear: Tympanic membrane, ear canal and external ear normal.     Nose: Nose normal. No congestion or rhinorrhea.     Mouth/Throat:     Mouth: Mucous membranes are moist.     Pharynx: No oropharyngeal exudate or posterior oropharyngeal erythema.  Eyes:     Pupils: Pupils are equal, round, and reactive to light.  Cardiovascular:     Rate and Rhythm: Normal rate and regular rhythm.     Heart sounds: Normal heart sounds.  Pulmonary:     Effort: Pulmonary effort is normal.     Breath sounds: Normal breath sounds.  Abdominal:     General: There is no distension.     Palpations: Abdomen is soft. There is mass.     Tenderness: There is no abdominal tenderness. There is no guarding.       Comments: Bottom of xiphoid process/epigastric area there is a 5-6 cm hard immobile mass. Not tender to touch.  Musculoskeletal:     Cervical back: Normal range  of motion.     Right lower leg: No edema.     Left lower leg: No edema.  Lymphadenopathy:     Cervical: No cervical adenopathy.  Skin:    General: Skin is warm.  Neurological:     Mental Status: He is  alert and oriented to person, place, and time.  Psychiatric:        Attention and Perception: Attention normal.        Mood and Affect: Mood normal.        Speech: Speech normal.        Behavior: Behavior normal. Behavior is cooperative.     Last depression screening scores    10/28/2021    8:38 AM 10/02/2020    3:55 PM 07/22/2017    8:28 AM  PHQ 2/9 Scores  PHQ - 2 Score 0 0 0  PHQ- 9 Score 0 3    Last fall risk screening    10/28/2021    8:38 AM  Fall Risk   Falls in the past year? 0  Number falls in past yr: 0  Injury with Fall? 0  Risk for fall due to : No Fall Risks  Follow up Falls evaluation completed   Last Audit-C alcohol use screening    10/28/2021    8:38 AM  Alcohol Use Disorder Test (AUDIT)  1. How often do you have a drink containing alcohol? 2  2. How many drinks containing alcohol do you have on a typical day when you are drinking? 1  3. How often do you have six or more drinks on one occasion? 1  AUDIT-C Score 4  4. How often during the last year have you found that you were not able to stop drinking once you had started? 0  5. How often during the last year have you failed to do what was normally expected from you because of drinking? 0  6. How often during the last year have you needed a first drink in the morning to get yourself going after a heavy drinking session? 0  7. How often during the last year have you had a feeling of guilt of remorse after drinking? 0  8. How often during the last year have you been unable to remember what happened the night before because you had been drinking? 0  9. Have you or someone else been injured as a result of your drinking? 0  10. Has a relative or friend or a doctor or another health worker been concerned about  your drinking or suggested you cut down? 0  Alcohol Use Disorder Identification Test Final Score (AUDIT) 4   A score of 3 or more in women, and 4 or more in men indicates increased risk for alcohol abuse, EXCEPT if all of the points are from question 1   No results found for any visits on 10/28/21.  Assessment & Plan    Routine Health Maintenance and Physical Exam  Exercise Activities and Dietary recommendations --balanced diet high in fiber and protein, low in sugars, carbs, fats. --physical activity/exercise 30 minutes 3-5 times a week   --Stop chewing tobacco. See the dentist biannually. Immunization History  Administered Date(s) Administered   Tdap 06/04/2013    Health Maintenance  Topic Date Due   COVID-19 Vaccine (1) Never done   HIV Screening  Never done   Hepatitis C Screening  Never done   COLONOSCOPY (Pts 45-60yrs Insurance coverage will need to be confirmed)  Never done   INFLUENZA VACCINE  11/12/2021   TETANUS/TDAP  06/05/2023   HPV VACCINES  Aged Out    Discussed health benefits of physical activity, and encouraged him to engage in regular exercise appropriate for his age and condition.  Problem List Items Addressed This Visit  Musculoskeletal and Integument   Intertrigo    Vs heat rash Advised keep the area clean and dry, rx Lotrisone cream can use topically BID      Relevant Medications   clotrimazole-betamethasone (LOTRISONE) cream   Onychomycosis    Ref to podiatry       Relevant Medications   clotrimazole-betamethasone (LOTRISONE) cream   Other Relevant Orders   Ambulatory referral to Podiatry     Other   Prediabetes    Historically will recheck      Relevant Orders   HgB A1c   Epigastric mass    Unclear if it is a hernia d/t firmness Last cxr 2022 with no abnormalities Will order cxr and abdominal ultrasound      Relevant Orders   DG Chest 2 View   US Abdomen Complete   Other Visit Diagnoses     Annual physical exam    -   Primary   Relevant Orders   CBC w/Diff/Platelet   Comprehensive Metabolic Panel (CMET)   Lipid Profile   Colon cancer screening       Relevant Orders   Ambulatory referral to Gastroenterology        Return in about 1 year (around 10/29/2022) for CPE.     I, Keith Ferguson, PA-C have reviewed all documentation for this visit. The documentation on  10/28/2021 for the exam, diagnosis, procedures, and orders are all accurate and complete.Keith Ferguson, PA-C [facility] [address] [place], [place], [address] Office: [phone] Fax: [phone]   [facility]

## 2021-10-28 NOTE — Assessment & Plan Note (Signed)
Ref to podiatry

## 2021-10-28 NOTE — Assessment & Plan Note (Signed)
Unclear if it is a hernia d/t firmness Last cxr 2022 with no abnormalities Will order cxr and abdominal ultrasound

## 2021-10-28 NOTE — Assessment & Plan Note (Signed)
Vs heat rash Advised keep the area clean and dry, rx Lotrisone cream can use topically BID

## 2021-10-29 ENCOUNTER — Telehealth: Payer: Self-pay

## 2021-10-29 ENCOUNTER — Other Ambulatory Visit: Payer: Self-pay

## 2021-10-29 DIAGNOSIS — Z1211 Encounter for screening for malignant neoplasm of colon: Secondary | ICD-10-CM

## 2021-10-29 LAB — COMPREHENSIVE METABOLIC PANEL
ALT: 26 IU/L (ref 0–44)
AST: 21 IU/L (ref 0–40)
Albumin/Globulin Ratio: 1.7 (ref 1.2–2.2)
Albumin: 4.3 g/dL (ref 4.1–5.1)
Alkaline Phosphatase: 69 IU/L (ref 44–121)
BUN/Creatinine Ratio: 14 (ref 9–20)
BUN: 13 mg/dL (ref 6–24)
Bilirubin Total: 0.7 mg/dL (ref 0.0–1.2)
CO2: 23 mmol/L (ref 20–29)
Calcium: 9.5 mg/dL (ref 8.7–10.2)
Chloride: 104 mmol/L (ref 96–106)
Creatinine, Ser: 0.96 mg/dL (ref 0.76–1.27)
Globulin, Total: 2.5 g/dL (ref 1.5–4.5)
Glucose: 107 mg/dL — ABNORMAL HIGH (ref 70–99)
Potassium: 4.4 mmol/L (ref 3.5–5.2)
Sodium: 141 mmol/L (ref 134–144)
Total Protein: 6.8 g/dL (ref 6.0–8.5)
eGFR: 99 mL/min/{1.73_m2} (ref >59)

## 2021-10-29 LAB — HEMOGLOBIN A1C
Est. average glucose Bld gHb Est-mCnc: 114 mg/dL
Hgb A1c MFr Bld: 5.6 % (ref 4.8–5.6)

## 2021-10-29 LAB — CBC WITH DIFFERENTIAL/PLATELET
Basophils Absolute: 0 10*3/uL (ref 0.0–0.2)
Basos: 1 %
EOS (ABSOLUTE): 0.3 10*3/uL (ref 0.0–0.4)
Eos: 5 %
Hematocrit: 47.4 % (ref 37.5–51.0)
Hemoglobin: 16.2 g/dL (ref 13.0–17.7)
Immature Grans (Abs): 0 10*3/uL (ref 0.0–0.1)
Immature Granulocytes: 0 %
Lymphocytes Absolute: 1.5 10*3/uL (ref 0.7–3.1)
Lymphs: 26 %
MCH: 30.3 pg (ref 26.6–33.0)
MCHC: 34.2 g/dL (ref 31.5–35.7)
MCV: 89 fL (ref 79–97)
Monocytes Absolute: 0.7 10*3/uL (ref 0.1–0.9)
Monocytes: 12 %
Neutrophils Absolute: 3.3 10*3/uL (ref 1.4–7.0)
Neutrophils: 56 %
Platelets: 250 10*3/uL (ref 150–450)
RBC: 5.34 x10E6/uL (ref 4.14–5.80)
RDW: 12.3 % (ref 11.6–15.4)
WBC: 5.9 10*3/uL (ref 3.4–10.8)

## 2021-10-29 LAB — LIPID PANEL
Chol/HDL Ratio: 4.3 ratio (ref 0.0–5.0)
Cholesterol, Total: 166 mg/dL (ref 100–199)
HDL: 39 mg/dL — ABNORMAL LOW (ref >39)
LDL Chol Calc (NIH): 109 mg/dL — ABNORMAL HIGH (ref 0–99)
Triglycerides: 97 mg/dL (ref 0–149)
VLDL Cholesterol Cal: 18 mg/dL (ref 5–40)

## 2021-10-29 MED ORDER — NA SULFATE-K SULFATE-MG SULF 17.5-3.13-1.6 GM/177ML PO SOLN: 354.0000 mL | Freq: Once | ORAL | 0 refills | Status: AC

## 2021-10-29 NOTE — Telephone Encounter (Signed)
Gastroenterology Pre-Procedure Review  Request Date: 12/13/2021 Requesting Physician: Dr. Allegra Lai  PATIENT REVIEW QUESTIONS: The patient responded to the following health history questions as indicated:    1. Are you having any GI issues? no 2. Do you have a personal history of Polyps? no 3. Do you have a family history of Colon Cancer or Polyps? no 4. Diabetes Mellitus? no 5. Joint replacements in the past 12 months?no 6. Major health problems in the past 3 months?no 7. Any artificial heart valves, MVP, or defibrillator?no    MEDICATIONS & ALLERGIES:    Patient reports the following regarding taking any anticoagulation/antiplatelet therapy:   Plavix, Coumadin, Eliquis, Xarelto, Lovenox, Pradaxa, Brilinta, or Effient? no Aspirin? no  Patient confirms/reports the following medications:  Current Outpatient Medications  Medication Sig Dispense Refill   clotrimazole-betamethasone (LOTRISONE) cream Apply 1 Application topically daily. 45 g 1   phentermine 37.5 MG capsule Take 37.5 mg by mouth 2 (two) times daily.     No current facility-administered medications for this visit.    Patient confirms/reports the following allergies:  No Known Allergies  No orders of the defined types were placed in this encounter.   AUTHORIZATION INFORMATION Primary Insurance: 1D#: Group #:  Secondary Insurance: 1D#: Group #:  SCHEDULE INFORMATION: Date:  Time: Location:

## 2021-10-31 ENCOUNTER — Telehealth: Payer: BC Managed Care – PPO | Admitting: Family Medicine

## 2021-10-31 DIAGNOSIS — M545 Low back pain, unspecified: Secondary | ICD-10-CM

## 2021-10-31 DIAGNOSIS — G8929 Other chronic pain: Secondary | ICD-10-CM

## 2021-10-31 MED ORDER — NAPROXEN 500 MG PO TABS: 500.0000 mg | ORAL_TABLET | Freq: Two times a day (BID) | ORAL | 0 refills | Status: DC

## 2021-10-31 MED ORDER — CYCLOBENZAPRINE HCL 10 MG PO TABS: 10.0000 mg | ORAL_TABLET | Freq: Three times a day (TID) | ORAL | 0 refills | Status: DC | PRN

## 2021-10-31 NOTE — Progress Notes (Signed)

## 2021-11-04 ENCOUNTER — Ambulatory Visit
Admission: RE | Admit: 2021-11-04 | Discharge: 2021-11-04 | Disposition: A | Discharge status: Home / Self Care | Payer: BC Managed Care – PPO | Source: Ambulatory Visit | Attending: Physician Assistant | Admitting: Physician Assistant

## 2021-11-04 DIAGNOSIS — R1906 Epigastric swelling, mass or lump: Secondary | ICD-10-CM | POA: Diagnosis not present

## 2021-11-12 ENCOUNTER — Ambulatory Visit: Payer: BC Managed Care – PPO | Admitting: Podiatry

## 2021-11-12 DIAGNOSIS — Z79899 Other long term (current) drug therapy: Secondary | ICD-10-CM | POA: Diagnosis not present

## 2021-11-12 DIAGNOSIS — B351 Tinea unguium: Secondary | ICD-10-CM | POA: Diagnosis not present

## 2021-11-12 NOTE — Progress Notes (Signed)
  Subjective:  Patient ID: Keith Fox, male    DOB: [birth year],  MRN: [id number]  Chief Complaint  Patient presents with   Nail Problem    46 y.o. male presents with the above complaint.  Patient presents with bilateral thickened elongated dystrophic toenails x2.  Patient states is painful to touch little bit.  Patient would like to discuss treatment options for treating nail fungus.  He is tried over-the-counter medication none of which has helped.  He would like to discuss medication for this.  He denies any other acute complaints he has not seen anyone else prior to seeing me for this.   Review of Systems: Negative except as noted in the HPI. Denies N/V/F/Ch.  No past medical history on file.  Current Outpatient Medications:    clotrimazole-betamethasone (LOTRISONE) cream, Apply 1 Application topically daily., Disp: 45 g, Rfl: 1   cyclobenzaprine (FLEXERIL) 10 MG tablet, Take 1 tablet (10 mg total) by mouth 3 (three) times daily as needed for muscle spasms., Disp: 30 tablet, Rfl: 0   naproxen (NAPROSYN) 500 MG tablet, Take 1 tablet (500 mg total) by mouth 2 (two) times daily with a meal., Disp: 30 tablet, Rfl: 0   phentermine 37.5 MG capsule, Take 37.5 mg by mouth 2 (two) times daily., Disp: , Rfl:   Social History   Tobacco Use  Smoking Status Never  Smokeless Tobacco Current   Types: Chew    No Known Allergies Objective:  There were no vitals filed for this visit. There is no height or weight on file to calculate BMI. Constitutional Well developed. Well nourished.  Vascular Dorsalis pedis pulses palpable bilaterally. Posterior tibial pulses palpable bilaterally. Capillary refill normal to all digits.  No cyanosis or clubbing noted. Pedal hair growth normal.  Neurologic Normal speech. Oriented to person, place, and time. Epicritic sensation to light touch grossly present bilaterally.  Dermatologic Nails thickened elongated dystrophic mycotic toenails x2  bilateral hallux Skin within normal limits  Orthopedic: Normal joint ROM without pain or crepitus bilaterally. No visible deformities. No bony tenderness.   Radiographs: None Assessment:   1. Encounter for long-term (current) use of high-risk medication   2. Nail fungus   3. Onychomycosis due to dermatophyte    Plan:  Patient was evaluated and treated and all questions answered.  Bilateral hallux onychomycosis -Educated the patient on the etiology of onychomycosis and various treatment options associated with improving the fungal load.  I explained to the patient that there is 3 treatment options available to treat the onychomycosis including topical, p.o., laser treatment.  Patient elected to undergo p.o. options with Lamisil/terbinafine therapy.  In order for me to start the medication therapy, I explained to the patient the importance of evaluating the liver and obtaining the liver function test.  Once the liver function test comes back normal I will start him on 28-month course of Lamisil therapy.  Patient understood all risk and would like to proceed with Lamisil therapy.  I have asked the patient to immediately stop the Lamisil therapy if she has any reactions to it and call the office or go to the emergency room right away.  Patient states understanding   No follow-ups on file.

## 2021-11-14 DIAGNOSIS — B351 Tinea unguium: Secondary | ICD-10-CM | POA: Diagnosis not present

## 2021-11-15 LAB — HEPATIC FUNCTION PANEL
ALT: 30 IU/L (ref 0–44)
AST: 15 IU/L (ref 0–40)
Albumin: 4.4 g/dL (ref 4.1–5.1)
Alkaline Phosphatase: 76 IU/L (ref 44–121)
Bilirubin Total: 0.4 mg/dL (ref 0.0–1.2)
Bilirubin, Direct: 0.13 mg/dL (ref 0.00–0.40)
Total Protein: 6.7 g/dL (ref 6.0–8.5)

## 2021-11-15 MED ORDER — TERBINAFINE HCL 250 MG PO TABS: 250.0000 mg | ORAL_TABLET | Freq: Every day | ORAL | 0 refills | Status: DC

## 2021-11-15 NOTE — Addendum Note (Signed)
Addended by: Nicholes Rough on: 11/15/2021 10:50 AM   Modules accepted: Orders

## 2021-12-11 ENCOUNTER — Telehealth: Payer: Self-pay | Admitting: Gastroenterology

## 2021-12-11 NOTE — Telephone Encounter (Signed)
Pt called and cancelled procedure for 9/1 will call back and reschesd

## 2021-12-12 NOTE — Telephone Encounter (Signed)
Called patient and patient states he does not want to reschedule at this time. Called Endo and talk to [place] and got her to cancel the procedure

## 2021-12-13 ENCOUNTER — Encounter: Admission: RE | Payer: Self-pay | Source: Ambulatory Visit

## 2021-12-13 ENCOUNTER — Ambulatory Visit
Admission: RE | Admit: 2021-12-13 | Payer: BC Managed Care – PPO | Source: Ambulatory Visit | Admitting: Gastroenterology

## 2021-12-13 SURGERY — COLONOSCOPY WITH PROPOFOL
Anesthesia: General

## 2022-03-13 ENCOUNTER — Ambulatory Visit: Payer: BC Managed Care – PPO | Admitting: Podiatry

## 2022-06-17 ENCOUNTER — Telehealth: Payer: BC Managed Care – PPO | Admitting: Physician Assistant

## 2022-06-17 DIAGNOSIS — G8929 Other chronic pain: Secondary | ICD-10-CM | POA: Diagnosis not present

## 2022-06-17 DIAGNOSIS — M545 Low back pain, unspecified: Secondary | ICD-10-CM | POA: Diagnosis not present

## 2022-06-17 MED ORDER — NAPROXEN 500 MG PO TABS: 500.0000 mg | ORAL_TABLET | Freq: Two times a day (BID) | ORAL | 0 refills | Status: DC

## 2022-06-17 MED ORDER — CYCLOBENZAPRINE HCL 10 MG PO TABS: 10.0000 mg | ORAL_TABLET | Freq: Three times a day (TID) | ORAL | 0 refills | Status: DC | PRN

## 2022-06-17 NOTE — Progress Notes (Signed)

## 2022-06-17 NOTE — Progress Notes (Signed)
I have spent 5 minutes in review of e-visit questionnaire, review and updating patient chart, medical decision making and response to patient.   Raequon Catanzaro Cody Crystol Walpole, PA-C    

## 2022-09-01 ENCOUNTER — Telehealth: Payer: BC Managed Care – PPO | Admitting: Family Medicine

## 2022-09-01 DIAGNOSIS — M25569 Pain in unspecified knee: Secondary | ICD-10-CM

## 2022-09-01 NOTE — Progress Notes (Signed)
[place]   Knee pain- with known history of fluid on knee that had to be drained. It is painful and hard to walk. Needs in person assessment to see if needs draining again. Advised of [facility] near him.  Patient acknowledged agreement and understanding of the plan.

## 2022-10-06 ENCOUNTER — Telehealth: Payer: Self-pay | Admitting: Physician Assistant

## 2022-10-30 ENCOUNTER — Encounter: Payer: BC Managed Care – PPO | Admitting: Physician Assistant

## 2023-04-03 ENCOUNTER — Telehealth: Payer: BC Managed Care – PPO | Admitting: Physician Assistant

## 2023-04-03 DIAGNOSIS — J208 Acute bronchitis due to other specified organisms: Secondary | ICD-10-CM | POA: Diagnosis not present

## 2023-04-03 DIAGNOSIS — B9689 Other specified bacterial agents as the cause of diseases classified elsewhere: Secondary | ICD-10-CM | POA: Diagnosis not present

## 2023-04-03 MED ORDER — AZITHROMYCIN 250 MG PO TABS: ORAL_TABLET | ORAL | 0 refills | Status: AC

## 2023-04-03 MED ORDER — BENZONATATE 100 MG PO CAPS: 100.0000 mg | ORAL_CAPSULE | Freq: Three times a day (TID) | ORAL | 0 refills | Status: DC | PRN

## 2023-04-03 NOTE — Progress Notes (Signed)

## 2023-06-27 ENCOUNTER — Telehealth: Payer: Self-pay | Admitting: Nurse Practitioner

## 2023-06-27 DIAGNOSIS — M1711 Unilateral primary osteoarthritis, right knee: Secondary | ICD-10-CM

## 2023-06-27 MED ORDER — PREDNISONE 20 MG PO TABS: 40.0000 mg | ORAL_TABLET | Freq: Every day | ORAL | 0 refills | Status: AC

## 2023-06-27 NOTE — Patient Instructions (Signed)
  Nicki Guadalajara Sutley, thank you for joining Claiborne Rigg, NP for today's virtual visit.  While this provider is not your primary care provider (PCP), if your PCP is located in our provider database this encounter information will be shared with them immediately following your visit.   A [facility] MyChart account gives you access to today's visit and all your visits, tests, and labs performed at [facility] " click here if you don't have a [facility] MyChart account or go to [email or web address]  Consent: (Patient) Kerem Gilmer Selley provided verbal consent for this virtual visit at the beginning of the encounter.  Current Medications:  Current Outpatient Medications:    predniSONE (DELTASONE) 20 MG tablet, Take 2 tablets (40 mg total) by mouth daily with breakfast for 7 days., Disp: 14 tablet, Rfl: 0   benzonatate (TESSALON) 100 MG capsule, Take 1-2 capsules (100-200 mg total) by mouth 3 (three) times daily as needed., Disp: 30 capsule, Rfl: 0   clotrimazole-betamethasone (LOTRISONE) cream, Apply 1 Application topically daily., Disp: 45 g, Rfl: 1   cyclobenzaprine (FLEXERIL) 10 MG tablet, Take 1 tablet (10 mg total) by mouth 3 (three) times daily as needed for muscle spasms., Disp: 30 tablet, Rfl: 0   naproxen (NAPROSYN) 500 MG tablet, Take 1 tablet (500 mg total) by mouth 2 (two) times daily with a meal., Disp: 30 tablet, Rfl: 0   phentermine 37.5 MG capsule, Take 37.5 mg by mouth 2 (two) times daily., Disp: , Rfl:    terbinafine (LAMISIL) 250 MG tablet, Take 1 tablet (250 mg total) by mouth daily., Disp: 90 tablet, Rfl: 0   Medications ordered in this encounter:  Meds ordered this encounter  Medications   predniSONE (DELTASONE) 20 MG tablet    Sig: Take 2 tablets (40 mg total) by mouth daily with breakfast for 7 days.    Dispense:  14 tablet    Refill:  0    Supervising Provider:   Merrilee Jansky [[id number]]     *If you need refills on other medications prior  to your next appointment, please contact your pharmacy*  Follow-Up: Call back or seek an in-person evaluation if the symptoms worsen or if the condition fails to improve as anticipated.  [facility] [phone]  Other Instructions May alternate with heat and ice application for pain relief. May also alternate with acetaminophen and Ibuprofen as prescribed pain relief. Other alternatives include massage, acupuncture and water aerobics.     If you have been instructed to have an in-person evaluation today at a local Urgent Care facility, please use the link below. It will take you to a list of all of our available [facility], including address, phone number and hours of operation. Please do not delay care.  [facility]  If you or a family member do not have a primary care provider, use the link below to schedule a visit and establish care. When you choose a [facility] primary care physician or advanced practice provider, you gain a long-term partner in health. Find a Primary Care Provider  Learn more about [facility]'s in-office and virtual care options: [facility] - Get Care Now

## 2023-06-27 NOTE — Progress Notes (Signed)
 Virtual Visit Consent   Keith Fox, you are scheduled for a virtual visit with a [facility] provider today. Just as with appointments in the office, your consent must be obtained to participate. Your consent will be active for this visit and any virtual visit you may have with one of our providers in the next 365 days. If you have a MyChart account, a copy of this consent can be sent to you electronically.  As this is a virtual visit, video technology does not allow for your provider to perform a traditional examination. This may limit your provider's ability to fully assess your condition. If your provider identifies any concerns that need to be evaluated in person or the need to arrange testing (such as labs, EKG, etc.), we will make arrangements to do so. Although advances in technology are sophisticated, we cannot ensure that it will always work on either your end or our end. If the connection with a video visit is poor, the visit may have to be switched to a telephone visit. With either a video or telephone visit, we are not always able to ensure that we have a secure connection.  By engaging in this virtual visit, you consent to the provision of healthcare and authorize for your insurance to be billed (if applicable) for the services provided during this visit. Depending on your insurance coverage, you may receive a charge related to this service.  I need to obtain your verbal consent now. Are you willing to proceed with your visit today? Keith Fox has provided verbal consent on 06/27/2023 for a virtual visit (video or telephone). Claiborne Rigg, NP  Date: 06/27/2023 6:12 PM   Virtual Visit via Video Note   I, Claiborne Rigg, connected with  Keith Fox  ([id number], Feb 02, 1976) on 06/27/23 at  6:15 PM EDT by a video-enabled telemedicine application and verified that I am speaking with the correct person using two identifiers.  Location: Patient: Virtual Visit  Location Patient: Home Provider: Virtual Visit Location Provider: Home Office   I discussed the limitations of evaluation and management by telemedicine and the availability of in person appointments. The patient expressed understanding and agreed to proceed.    History of Present Illness: Keith Fox is a 48 y.o. who identifies as a male who was assigned male at birth, and is being seen today for right knee pain.  Keith Fox has a history of primary OA of both knees. He has currently been experiencing right knee pain and swelling for over 1 week now. Mobility is limited due to pain and swelling. Taking Ibuprofen and tylenol as well as icing and heat packs with no relief.     Problems:  Patient Active Problem List   Diagnosis Date Noted   Prediabetes 10/28/2021   Intertrigo 10/28/2021   Epigastric mass 10/28/2021   Onychomycosis 10/28/2021   Allergic rhinitis 07/22/2017   Class 3 severe obesity due to excess calories without serious comorbidity with body mass index (BMI) of 50.0 to 59.9 in adult [facility]) 07/22/2017    Allergies: No Known Allergies Medications:  Current Outpatient Medications:    predniSONE (DELTASONE) 20 MG tablet, Take 2 tablets (40 mg total) by mouth daily with breakfast for 7 days., Disp: 14 tablet, Rfl: 0   benzonatate (TESSALON) 100 MG capsule, Take 1-2 capsules (100-200 mg total) by mouth 3 (three) times daily as needed., Disp: 30 capsule, Rfl: 0   clotrimazole-betamethasone (LOTRISONE) cream, Apply 1 Application topically daily., Disp:  45 g, Rfl: 1   cyclobenzaprine (FLEXERIL) 10 MG tablet, Take 1 tablet (10 mg total) by mouth 3 (three) times daily as needed for muscle spasms., Disp: 30 tablet, Rfl: 0   naproxen (NAPROSYN) 500 MG tablet, Take 1 tablet (500 mg total) by mouth 2 (two) times daily with a meal., Disp: 30 tablet, Rfl: 0   phentermine 37.5 MG capsule, Take 37.5 mg by mouth 2 (two) times daily., Disp: , Rfl:    terbinafine (LAMISIL) 250 MG tablet,  Take 1 tablet (250 mg total) by mouth daily., Disp: 90 tablet, Rfl: 0  Observations/Objective: Patient is well-developed, well-nourished in no acute distress.  Resting comfortably at home.  Head is normocephalic, atraumatic.  No labored breathing.  Speech is clear and coherent with logical content.  Patient is alert and oriented at baseline.    Assessment and Plan: 1. Primary osteoarthritis of right knee (Primary) - predniSONE (DELTASONE) 20 MG tablet; Take 2 tablets (40 mg total) by mouth daily with breakfast for 7 days.  Dispense: 14 tablet; Refill: 0  May alternate with heat and ice application for pain relief. May also alternate with acetaminophen and Ibuprofen as prescribed pain relief. Other alternatives include massage, acupuncture and water aerobics.     Follow Up Instructions: I discussed the assessment and treatment plan with the patient. The patient was provided an opportunity to ask questions and all were answered. The patient agreed with the plan and demonstrated an understanding of the instructions.  A copy of instructions were sent to the patient via MyChart unless otherwise noted below.    The patient was advised to call back or seek an in-person evaluation if the symptoms worsen or if the condition fails to improve as anticipated.    Claiborne Rigg, NP

## 2023-07-29 ENCOUNTER — Ambulatory Visit: Admitting: Physician Assistant

## 2023-07-29 ENCOUNTER — Encounter: Payer: Self-pay | Admitting: Physician Assistant

## 2023-07-29 VITALS — BP 127/82 | HR 69 | Ht 71.0 in | Wt 325.2 lb

## 2023-07-29 DIAGNOSIS — Z6841 Body Mass Index (BMI) 40.0 and over, adult: Secondary | ICD-10-CM

## 2023-07-29 DIAGNOSIS — M25561 Pain in right knee: Secondary | ICD-10-CM

## 2023-07-29 DIAGNOSIS — G8929 Other chronic pain: Secondary | ICD-10-CM | POA: Diagnosis not present

## 2023-07-29 DIAGNOSIS — R251 Tremor, unspecified: Secondary | ICD-10-CM

## 2023-07-29 NOTE — Progress Notes (Unsigned)
      Established patient visit   Patient: Keith Fox   DOB: 10/08/1975   48 y.o. Male  MRN: [id number] Visit Date: 07/29/2023  Today's healthcare provider: Trenton Frock, PA-C   No chief complaint on file.  Subjective      Medications: Outpatient Medications Prior to Visit  Medication Sig   benzonatate (TESSALON) 100 MG capsule Take 1-2 capsules (100-200 mg total) by mouth 3 (three) times daily as needed.   clotrimazole-betamethasone (LOTRISONE) cream Apply 1 Application topically daily.   cyclobenzaprine (FLEXERIL) 10 MG tablet Take 1 tablet (10 mg total) by mouth 3 (three) times daily as needed for muscle spasms.   naproxen (NAPROSYN) 500 MG tablet Take 1 tablet (500 mg total) by mouth 2 (two) times daily with a meal.   phentermine 37.5 MG capsule Take 37.5 mg by mouth 2 (two) times daily.   terbinafine (LAMISIL) 250 MG tablet Take 1 tablet (250 mg total) by mouth daily.   No facility-administered medications prior to visit.    Review of Systems {Insert previous labs (optional):23779} {See past labs  Heme  Chem  Endocrine  Serology  Results Review (optional):1}   Objective    There were no vitals taken for this visit. {Insert last BP/Wt (optional):23777}{See vitals history (optional):1}  Physical Exam Constitutional:      General: He is awake.     Appearance: He is well-developed.  HENT:     Head: Normocephalic.  Eyes:     Conjunctiva/sclera: Conjunctivae normal.  Cardiovascular:     Rate and Rhythm: Normal rate and regular rhythm.     Heart sounds: Normal heart sounds.  Pulmonary:     Effort: Pulmonary effort is normal.     Breath sounds: Normal breath sounds.  Musculoskeletal:     Comments: Right knee full ROM, no joint laxity  Some weakness w/ extension  Full flexion  Skin:    General: Skin is warm.  Neurological:     Mental Status: He is alert and oriented to person, place, and time.  Psychiatric:        Attention and Perception:  Attention normal.        Mood and Affect: Mood normal.        Speech: Speech normal.        Behavior: Behavior is cooperative.     ***  No results found for any visits on 07/29/23.  Assessment & Plan    There are no diagnoses linked to this encounter.  ***  No follow-ups on file.       Trenton Frock, PA-C  [facility] at [facility] [phone] (phone) [phone] (fax)  [facility]

## 2023-07-30 ENCOUNTER — Other Ambulatory Visit: Payer: Self-pay | Admitting: Physician Assistant

## 2023-07-30 ENCOUNTER — Other Ambulatory Visit (HOSPITAL_COMMUNITY): Payer: Self-pay

## 2023-07-30 ENCOUNTER — Telehealth: Payer: Self-pay

## 2023-07-30 ENCOUNTER — Encounter: Payer: Self-pay | Admitting: Physician Assistant

## 2023-07-30 DIAGNOSIS — R251 Tremor, unspecified: Secondary | ICD-10-CM | POA: Insufficient documentation

## 2023-07-30 DIAGNOSIS — G8929 Other chronic pain: Secondary | ICD-10-CM | POA: Insufficient documentation

## 2023-07-30 LAB — COMPREHENSIVE METABOLIC PANEL WITH GFR
ALT: 44 U/L (ref 0–53)
AST: 24 U/L (ref 0–37)
Albumin: 4.7 g/dL (ref 3.5–5.2)
Alkaline Phosphatase: 56 U/L (ref 39–117)
BUN: 15 mg/dL (ref 6–23)
CO2: 25 meq/L (ref 19–32)
Calcium: 9.2 mg/dL (ref 8.4–10.5)
Chloride: 103 meq/L (ref 96–112)
Creatinine, Ser: 0.89 mg/dL (ref 0.40–1.50)
GFR: 102.21 mL/min (ref >60.00)
Glucose, Bld: 126 mg/dL — ABNORMAL HIGH (ref 70–99)
Potassium: 3.9 meq/L (ref 3.5–5.1)
Sodium: 136 meq/L (ref 135–145)
Total Bilirubin: 0.6 mg/dL (ref 0.2–1.2)
Total Protein: 7.1 g/dL (ref 6.0–8.3)

## 2023-07-30 LAB — HEMOGLOBIN A1C: Hgb A1c MFr Bld: 6.1 % (ref 4.6–6.5)

## 2023-07-30 LAB — CBC WITH DIFFERENTIAL/PLATELET
Basophils Absolute: 0.1 10*3/uL (ref 0.0–0.1)
Basophils Relative: 0.8 % (ref 0.0–3.0)
Eosinophils Absolute: 0.3 10*3/uL (ref 0.0–0.7)
Eosinophils Relative: 4.2 % (ref 0.0–5.0)
HCT: 47.6 % (ref 39.0–52.0)
Hemoglobin: 16.3 g/dL (ref 13.0–17.0)
Lymphocytes Relative: 29.6 % (ref 12.0–46.0)
Lymphs Abs: 2.3 10*3/uL (ref 0.7–4.0)
MCHC: 34.2 g/dL (ref 30.0–36.0)
MCV: 90.1 fl (ref 78.0–100.0)
Monocytes Absolute: 1.1 10*3/uL — ABNORMAL HIGH (ref 0.1–1.0)
Monocytes Relative: 14.2 % — ABNORMAL HIGH (ref 3.0–12.0)
Neutro Abs: 3.9 10*3/uL (ref 1.4–7.7)
Neutrophils Relative %: 51.2 % (ref 43.0–77.0)
Platelets: 249 10*3/uL (ref 150.0–400.0)
RBC: 5.29 Mil/uL (ref 4.22–5.81)
RDW: 12.9 % (ref 11.5–15.5)
WBC: 7.7 10*3/uL (ref 4.0–10.5)

## 2023-07-30 LAB — TSH: TSH: 2.2 u[IU]/mL (ref 0.35–5.50)

## 2023-07-30 MED ORDER — SEMAGLUTIDE-WEIGHT MANAGEMENT 0.5 MG/0.5ML ~~LOC~~ SOAJ: 0.5000 mg | SUBCUTANEOUS | 3 refills | Status: AC

## 2023-07-30 MED ORDER — SEMAGLUTIDE-WEIGHT MANAGEMENT 0.25 MG/0.5ML ~~LOC~~ SOAJ: 0.2500 mg | SUBCUTANEOUS | 0 refills | Status: AC

## 2023-07-30 NOTE — Assessment & Plan Note (Signed)
-   Refer to orthopedics for evaluation and management - Continue using ice, compression sleeves, and knee brace for symptom management.

## 2023-07-30 NOTE — Telephone Encounter (Signed)
 Pharmacy Patient Advocate Encounter   Received notification from CoverMyMeds that prior authorization for [facility] 0.25MG /0.5ML auto-injectors is required/requested.   Insurance verification completed.   The patient is insured through [facility] .   Per test claim: PA required; PA submitted to above mentioned insurance via CoverMyMeds Key/confirmation #/EOC (Key: [id number])   Status is pending

## 2023-07-30 NOTE — Assessment & Plan Note (Addendum)
 Pt has previously tried phentermine with some success. He reports he did lose 20+ pounds, and has kept it off, but was < 300 lbs and is now 325 lbs.   He does exercise frequently, 4+ days a week, playing baseball. He eats a balanced diet low in carbs and sugars, high in protein. - Order blood work to assess current prediabetic status and other relevant parameters. - Prescribe Wegovy or Ozempic pending insurance approval and blood work results. - Continue dietary modifications and explore further lifestyle changes.

## 2023-07-30 NOTE — Assessment & Plan Note (Signed)
 Intermittent hand tremor, particularly during fine motor tasks. Family history of Parkinson's disease, but the tremor does not currently exhibit classic Parkinsonian features Further evaluation may be needed if symptoms progress.

## 2023-07-31 ENCOUNTER — Other Ambulatory Visit (HOSPITAL_COMMUNITY): Payer: Self-pay

## 2023-07-31 NOTE — Telephone Encounter (Signed)
 Pharmacy Patient Advocate Encounter  Received notification from [facility] that Prior Authorization for Wegovy  0.25MG /0.5ML auto-injectors has been APPROVED  to 10.14.25. Ran test claim, Copay is $350.00. This test claim was processed through [facility]- copay amounts may vary at other pharmacies due to pharmacy/plan contracts, or as the patient moves through the different stages of their insurance plan.   PA #/Case ID/Reference #: (Key: [id number])

## 2023-09-01 ENCOUNTER — Telehealth: Payer: Self-pay

## 2023-09-01 DIAGNOSIS — G8929 Other chronic pain: Secondary | ICD-10-CM

## 2023-09-01 NOTE — Telephone Encounter (Signed)
 Spoke to patient and gave him the address to have his xray and he said he will go  also informed him the orders are waiting

## 2023-09-03 ENCOUNTER — Ambulatory Visit (INDEPENDENT_AMBULATORY_CARE_PROVIDER_SITE_OTHER): Admitting: Orthopedic Surgery

## 2023-09-03 ENCOUNTER — Ambulatory Visit
Admission: RE | Admit: 2023-09-03 | Discharge: 2023-09-03 | Disposition: A | Discharge status: Home / Self Care | Attending: Orthopedic Surgery | Admitting: Orthopedic Surgery

## 2023-09-03 ENCOUNTER — Ambulatory Visit
Admission: RE | Admit: 2023-09-03 | Discharge: 2023-09-03 | Disposition: A | Discharge status: Home / Self Care | Source: Ambulatory Visit | Attending: Orthopedic Surgery | Admitting: Orthopedic Surgery

## 2023-09-03 VITALS — BP 157/90 | Ht 71.0 in | Wt 326.0 lb

## 2023-09-03 DIAGNOSIS — M25561 Pain in right knee: Secondary | ICD-10-CM

## 2023-09-03 DIAGNOSIS — G8929 Other chronic pain: Secondary | ICD-10-CM

## 2023-09-03 DIAGNOSIS — M25461 Effusion, right knee: Secondary | ICD-10-CM | POA: Diagnosis not present

## 2023-09-03 DIAGNOSIS — M1711 Unilateral primary osteoarthritis, right knee: Secondary | ICD-10-CM | POA: Diagnosis not present

## 2023-09-03 MED ORDER — TRIAMCINOLONE ACETONIDE 40 MG/ML IJ SUSP
40.0000 mg | Freq: Once | INTRAMUSCULAR | Status: AC
Start: 2023-09-03 — End: 2023-09-03
  Administered 2023-09-03: 40 mg via INTRAMUSCULAR

## 2023-09-03 NOTE — Progress Notes (Signed)
 New Patient Visit  Assessment: Keith Fox is a 48 y.o. male with the following: 1. Chronic pain of right knee  Plan: Keith Fox has had pain in the right knee for a while.  He injured it a couple years ago.  The pain improved, and he was able to return to playing softball.  Approximately a month ago, he started to have pain and swelling once again.  Radiographs demonstrate some degenerative changes.  On exam, he does have an effusion.  His tenderness to palpation over the medial knee.  We discussed the possibility of aspiration, an injection.  We attempted aspiration in clinic today, which was unsuccessful.  We did proceed with a steroid injection.  If he continues to have issues, he will contact the clinic.  Procedure note injection Right knee joint   Verbal consent was obtained to inject the right knee joint  Timeout was completed to confirm the site of injection.  The skin was prepped with alcohol and ethyl chloride was sprayed at the injection site.  A 21-gauge needle was used to inject 40 mg of Depo-Medrol and 1% lidocaine  (4 cc) into the right knee using an anterolateral approach.  There were no complications. A sterile bandage was applied.     Follow-up: Return if symptoms worsen or fail to improve.  Subjective:  Chief Complaint  Patient presents with   Knee Pain    Right knee pain for 1 month or more  he injured the knee 2 years ago     History of Present Illness: Keith Fox is a 48 y.o. male who has been referred by Osborn Blaze, PA-C for evaluation of right knee pain.  He states he had pain in the right knee for the past month.  He plays a lot of softball.  Approximately 2 years ago, he states that he injured his right knee, which resulted in a lot of pain and swelling.  That pain improved, until recently.  About a month ago, while playing softball he feels that he twisted his knee.  Since then, he has had swelling and pain.  Medicines have not  been effective.  He uses a brace.  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  He has not had an injection.   Review of Systems: No fevers or chills No numbness or tingling No chest pain No shortness of breath No bowel or bladder dysfunction No GI distress No headaches   Medical History:  History reviewed. No pertinent past medical history.  Past Surgical History:  Procedure Laterality Date   OTHER SURGICAL HISTORY     had ulcer removed from small intestines    Family History  Problem Relation Age of Onset   Parkinson's disease Father    Breast cancer Neg Hx    Social History   Tobacco Use   Smoking status: Never   Smokeless tobacco: Current    Types: Chew  Vaping Use   Vaping status: Never Used  Substance Use Topics   Alcohol use: Yes    Alcohol/week: 3.0 standard drinks of alcohol    Types: 3 Cans of beer per week   Drug use: No    No Known Allergies  Current Meds  Medication Sig   Semaglutide -Weight Management 0.5 MG/0.5ML SOAJ Inject 0.5 mg into the skin once a week for 28 days. Start after 4 weeks of 0.25 mg    Objective: BP (!) 157/90   Ht 5\' 11"  (1.803 m)  Wt (!) 326 lb (147.9 kg)   BMI 45.47 kg/m   Physical Exam:  General: Alert and oriented. and No acute distress. Gait: Right sided antalgic gait.  Right knee with mild effusion.  Tenderness palpation over the medial joint line.  Negative Lachman.  No increased laxity to varus or valgus stress.  Pain with hyperflexion of the right knee.  IMAGING: I personally ordered and reviewed the following images   X-rays of the right knee were available in clinic today.  Mild loss of joint space within the medial compartment.  There are associated osteophytes.  No acute injuries.   New Medications:  Meds ordered this encounter  Medications   triamcinolone acetonide (KENALOG-40) injection 40 mg      Tonita Frater, MD  09/04/2023 9:27 AM

## 2023-09-03 NOTE — Patient Instructions (Signed)

## 2023-09-04 ENCOUNTER — Encounter: Payer: Self-pay | Admitting: Orthopedic Surgery

## 2023-09-24 ENCOUNTER — Telehealth: Admitting: Physician Assistant

## 2023-09-24 DIAGNOSIS — S3992XA Unspecified injury of lower back, initial encounter: Secondary | ICD-10-CM

## 2023-09-24 NOTE — Progress Notes (Signed)
  Because of pain starting after injury and need for physical examination, I feel your condition warrants further evaluation and I recommend that you be seen in a face-to-face visit.   NOTE: There will be NO CHARGE for this E-Visit   If you are having a true medical emergency, please call 911.     For an urgent face to face visit, [facility] has multiple urgent care centers for your convenience.  Click the link below for the full list of locations and hours, walk-in wait times, appointment scheduling options and driving directions:  Urgent Care - [place], [place], [place], [place], [place], [place]  [place]     Your MyChart E-visit questionnaire answers were reviewed by a board certified advanced clinical practitioner to complete your personal care plan based on your specific symptoms.    Thank you for using e-Visits.

## 2024-01-29 ENCOUNTER — Other Ambulatory Visit (HOSPITAL_COMMUNITY): Payer: Self-pay

## 2024-02-05 ENCOUNTER — Other Ambulatory Visit (HOSPITAL_COMMUNITY): Payer: Self-pay

## 2024-02-08 ENCOUNTER — Other Ambulatory Visit (HOSPITAL_COMMUNITY): Payer: Self-pay

## 2024-02-10 ENCOUNTER — Other Ambulatory Visit (HOSPITAL_COMMUNITY): Payer: Self-pay

## 2024-02-15 ENCOUNTER — Encounter: Payer: Self-pay | Admitting: Radiology

## 2024-03-03 ENCOUNTER — Telehealth: Admitting: Physician Assistant

## 2024-03-03 DIAGNOSIS — J069 Acute upper respiratory infection, unspecified: Secondary | ICD-10-CM

## 2024-03-03 MED ORDER — PSEUDOEPH-BROMPHEN-DM 30-2-10 MG/5ML PO SYRP: 5.0000 mL | ORAL_SOLUTION | Freq: Four times a day (QID) | ORAL | 0 refills | Status: AC | PRN

## 2024-03-03 MED ORDER — LIDOCAINE VISCOUS HCL 2 % MT SOLN: OROMUCOSAL | 0 refills | Status: AC

## 2024-03-03 MED ORDER — FLUTICASONE PROPIONATE 50 MCG/ACT NA SUSP: 2.0000 | Freq: Every day | NASAL | 6 refills | Status: AC

## 2024-03-03 NOTE — Progress Notes (Signed)

## 2024-04-11 ENCOUNTER — Emergency Department: Admission: EM | Admit: 2024-04-11 | Discharge: 2024-04-12 | Disposition: A | Discharge status: Home / Self Care

## 2024-04-11 DIAGNOSIS — K5792 Diverticulitis of intestine, part unspecified, without perforation or abscess without bleeding: Secondary | ICD-10-CM

## 2024-04-11 DIAGNOSIS — K5732 Diverticulitis of large intestine without perforation or abscess without bleeding: Secondary | ICD-10-CM | POA: Diagnosis not present

## 2024-04-11 DIAGNOSIS — R1030 Lower abdominal pain, unspecified: Secondary | ICD-10-CM | POA: Diagnosis not present

## 2024-04-11 LAB — COMPREHENSIVE METABOLIC PANEL WITH GFR
ALT: 40 U/L (ref 0–44)
AST: 22 U/L (ref 15–41)
Albumin: 4.5 g/dL (ref 3.5–5.0)
Alkaline Phosphatase: 77 U/L (ref 38–126)
Anion gap: 11 (ref 5–15)
BUN: 19 mg/dL (ref 6–20)
CO2: 27 mmol/L (ref 22–32)
Calcium: 9.2 mg/dL (ref 8.9–10.3)
Chloride: 99 mmol/L (ref 98–111)
Creatinine, Ser: 0.87 mg/dL (ref 0.61–1.24)
GFR, Estimated: >60 mL/min
Glucose, Bld: 148 mg/dL — ABNORMAL HIGH (ref 70–99)
Potassium: 4.4 mmol/L (ref 3.5–5.1)
Sodium: 136 mmol/L (ref 135–145)
Total Bilirubin: 0.6 mg/dL (ref 0.0–1.2)
Total Protein: 7.3 g/dL (ref 6.5–8.1)

## 2024-04-11 LAB — CBC
HCT: 46.8 % (ref 39.0–52.0)
Hemoglobin: 16.3 g/dL (ref 13.0–17.0)
MCH: 30.3 pg (ref 26.0–34.0)
MCHC: 34.8 g/dL (ref 30.0–36.0)
MCV: 87 fL (ref 80.0–100.0)
Platelets: 192 K/uL (ref 150–400)
RBC: 5.38 MIL/uL (ref 4.22–5.81)
RDW: 12.1 % (ref 11.5–15.5)
WBC: 6.5 K/uL (ref 4.0–10.5)
nRBC: 0 % (ref 0.0–0.2)

## 2024-04-11 LAB — LIPASE, BLOOD: Lipase: 30 U/L (ref 11–51)

## 2024-04-11 NOTE — ED Triage Notes (Signed)
 Pt presents to the ED via pov from home with lower abd pain since Friday. Denies N/V/D. A&Ox4. Ambulatory to triage room with wife

## 2024-04-12 ENCOUNTER — Emergency Department

## 2024-04-12 DIAGNOSIS — R1030 Lower abdominal pain, unspecified: Secondary | ICD-10-CM | POA: Diagnosis not present

## 2024-04-12 DIAGNOSIS — K5732 Diverticulitis of large intestine without perforation or abscess without bleeding: Secondary | ICD-10-CM | POA: Diagnosis not present

## 2024-04-12 LAB — URINALYSIS, ROUTINE W REFLEX MICROSCOPIC
Bilirubin Urine: NEGATIVE
Glucose, UA: NEGATIVE mg/dL
Hgb urine dipstick: NEGATIVE
Ketones, ur: NEGATIVE mg/dL
Leukocytes,Ua: NEGATIVE
Nitrite: NEGATIVE
Protein, ur: NEGATIVE mg/dL
Specific Gravity, Urine: 1.013 (ref 1.005–1.030)
pH: 5 (ref 5.0–8.0)

## 2024-04-12 MED ORDER — KETOROLAC TROMETHAMINE 15 MG/ML IJ SOLN
15.0000 mg | Freq: Once | INTRAMUSCULAR | Status: AC
  Administered 2024-04-12: 15 mg via INTRAVENOUS
  Filled 2024-04-12: qty 1

## 2024-04-12 MED ORDER — AMOXICILLIN-POT CLAVULANATE 875-125 MG PO TABS: 1.0000 | ORAL_TABLET | Freq: Three times a day (TID) | ORAL | 0 refills | Status: AC

## 2024-04-12 MED ORDER — ACETAMINOPHEN 500 MG PO TABS
1000.0000 mg | ORAL_TABLET | Freq: Once | ORAL | Status: AC
  Administered 2024-04-12: 1000 mg via ORAL
  Filled 2024-04-12: qty 2

## 2024-04-12 MED ORDER — IBUPROFEN 200 MG PO TABS: 600.0000 mg | ORAL_TABLET | Freq: Three times a day (TID) | ORAL | 2 refills | Status: AC | PRN

## 2024-04-12 MED ORDER — ACETAMINOPHEN 500 MG PO TABS: 1000.0000 mg | ORAL_TABLET | Freq: Four times a day (QID) | ORAL | 2 refills | Status: AC | PRN

## 2024-04-12 MED ORDER — IOHEXOL 350 MG/ML SOLN
100.0000 mL | Freq: Once | INTRAVENOUS | Status: AC | PRN
  Administered 2024-04-12: 100 mL via INTRAVENOUS

## 2024-04-12 NOTE — ED Provider Notes (Signed)
 "  [facility] Provider Note    Event Date/Time   First MD Initiated Contact with Patient 04/11/24 2339     (approximate)   History   Abdominal Pain  Pt presents to the ED via pov from home with lower abd pain since Friday. Denies N/V/D. A&Ox4. Ambulatory to triage room with wife   HPI Keith Fox is a 48 y.o. male PMH prediabetes, BMI greater than 40, chronic knee pain presents for evaluation of lower abdominal pain - Severe lower abdominal pain for about 3 days, 8/10, sharp.  Worse in suprapubic region though also in right and left lower quadrants.  Last bowel movement yesterday, unremarkable, no black or bloody stools, no diarrhea.  No fever, flank pain.  Does note dysuria and urinary frequency.  No upper abdominal pain. - No prior abdominal surgical history - No testicular pain or swelling - Has trialed Gas-X with no relief, has not yet taken any other medications       Physical Exam   Triage Vital Signs: ED Triage Vitals [04/11/24 1849]  Encounter Vitals Group     BP (!) 149/105     Girls Systolic BP Percentile      Girls Diastolic BP Percentile      Boys Systolic BP Percentile      Boys Diastolic BP Percentile      Pulse Rate 82     Resp 18     Temp 98.7 F (37.1 C)     Temp Source Oral     SpO2 97 %     Weight (!) 320 lb (145.2 kg)     Height 5' 11 (1.803 m)     Head Circumference      Peak Flow      Pain Score 6     Pain Loc      Pain Education      Exclude from Growth Chart     Most recent vital signs: Vitals:   04/11/24 1849 04/12/24 0002  BP: (!) 149/105 (!) 141/99  Pulse: 82 83  Resp: 18 18  Temp: 98.7 F (37.1 C) 97.9 F (36.6 C)  SpO2: 97% 97%     General: Awake, no distress.  CV:  Good peripheral perfusion. RRR, RP 2+ Resp:  Normal effort. CTAB Abd:  No distention.  No tenderness in upper abdomen, voluntary guarding in suprapubic region, moderate tenderness in right and left lower quadrants.  No CVA  tenderness bilaterally.    ED Results / Procedures / Treatments   Labs (all labs ordered are listed, but only abnormal results are displayed) Labs Reviewed  COMPREHENSIVE METABOLIC PANEL WITH GFR - Abnormal; Notable for the following components:      Result Value   Glucose, Bld 148 (*)    All other components within normal limits  URINALYSIS, ROUTINE W REFLEX MICROSCOPIC - Abnormal; Notable for the following components:   Color, Urine YELLOW (*)    APPearance CLEAR (*)    All other components within normal limits  LIPASE, BLOOD  CBC     EKG  N/a   RADIOLOGY Radiology interpreted by myself and radiology report reviewed.  Notable for uncomplicated diverticulitis.    PROCEDURES:  Critical Care performed: No  Procedures   MEDICATIONS ORDERED IN ED: Medications  ketorolac  (TORADOL ) 15 MG/ML injection 15 mg (15 mg Intravenous Given 04/12/24 0022)  acetaminophen  (TYLENOL ) tablet 1,000 mg (1,000 mg Oral Given 04/12/24 0022)  iohexol  (OMNIPAQUE ) 350 MG/ML injection 100 mL (100 mLs  Intravenous Contrast Given 04/12/24 0036)     IMPRESSION / MDM / ASSESSMENT AND PLAN / ED COURSE  I reviewed the triage vital signs and the nursing notes.                              DDX/MDM/AP: Differential diagnosis includes, but is not limited to, cystitis, urolithiasis, diverticulitis, appendicitis, consider prostatitis.  Plan: - Labs - Pain control - N.p.o. -  CT abdomen pelvis  Patient's presentation is most consistent with acute presentation with potential threat to life or bodily function.  ED course below.  Workup with uncomplicated diverticulitis.  Pain well-controlled with Toradol .  Rx Augmentin, Tylenol , Motrin .  Plan for PMD follow-up.  ED return precautions in place.  Clinical Course as of 04/12/24 0316  Tue Apr 12, 2024  0007 CBC, CMP, lipase reviewed, unremarkable [MM]  0028 Urinalysis with no hematuria, no evidence of infection [MM]  0302 CTAP: IMPRESSION: 1.  Early acute diverticulitis in the distal sigmoid colon with mild inflammatory stranding and no free intraperitoneal air.   [MM]  [phone] Patient reevaluated, remains feeling very well, pain has resolved.  Discussed CT findings.  Amenable to course of Augmentin, prefers to take first dose at home as opposed to a dose here in emergency department.  Will Rx Augmentin, Tylenol , Motrin .  Plan for PMD follow-up.  ED return turn precautions in place.  Patient and family agree with plan. [MM]    Clinical Course User Index [MM] Clarine Ozell LABOR, MD     FINAL CLINICAL IMPRESSION(S) / ED DIAGNOSES   Final diagnoses:  Diverticulitis     Rx / DC Orders   ED Discharge Orders          Ordered    amoxicillin -clavulanate (AUGMENTIN) 875-125 MG tablet  3 times daily        04/12/24 0314    acetaminophen  (TYLENOL ) 500 MG tablet  Every 6 hours PRN        04/12/24 0314    ibuprofen  (MOTRIN  IB) 200 MG tablet  Every 8 hours PRN        04/12/24 0314             Note:  This document was prepared using Dragon voice recognition software and may include unintentional dictation errors.   Clarine Ozell LABOR, MD 04/12/24 [phone]  "

## 2024-04-12 NOTE — Discharge Instructions (Addendum)
 Your evaluation in the emergency department was notable for a condition called diverticulitis which is inflammation of pouches on your colon.  This is generally treated with antibiotics-please take these as prescribed. I also prescribed Tylenol  and Motrin  to use as needed for any discomfort.  Please follow-up with your primary care provider for reevaluation, and return to the emergency department with any new or worsening symptoms including fever, worsened pain, inability to have a bowel movement, or any other symptoms concerning to you.

## 2024-05-25 ENCOUNTER — Encounter: Admitting: Nurse Practitioner
